# Patient Record
Sex: Female | Born: 1970 | Race: Black or African American | Hispanic: No | Marital: Single | Smoking: Never smoker
Health system: Southern US, Community
[De-identification: ages and names within clinical notes are randomized; demographics above are authoritative.]

## PROBLEM LIST (undated history)

## (undated) DIAGNOSIS — R200 Anesthesia of skin: Secondary | ICD-10-CM

## (undated) DIAGNOSIS — I10 Essential (primary) hypertension: Secondary | ICD-10-CM

## (undated) DIAGNOSIS — Z973 Presence of spectacles and contact lenses: Secondary | ICD-10-CM

## (undated) DIAGNOSIS — R202 Paresthesia of skin: Secondary | ICD-10-CM

## (undated) HISTORY — PX: SHOULDER SURGERY: SHX246

---

## 2007-05-18 ENCOUNTER — Ambulatory Visit (HOSPITAL_COMMUNITY): Admission: RE | Admit: 2007-05-18 | Discharge: 2007-05-18 | Payer: Self-pay | Admitting: *Deleted

## 2013-01-14 ENCOUNTER — Inpatient Hospital Stay (HOSPITAL_COMMUNITY): Payer: Self-pay

## 2013-01-14 ENCOUNTER — Encounter (HOSPITAL_COMMUNITY): Payer: Self-pay | Admitting: Medical

## 2013-01-14 ENCOUNTER — Inpatient Hospital Stay (HOSPITAL_COMMUNITY)
Admission: AD | Admit: 2013-01-14 | Discharge: 2013-01-14 | Disposition: A | Discharge status: Home / Self Care | Payer: Self-pay | Source: Ambulatory Visit | Attending: Obstetrics and Gynecology | Admitting: Obstetrics and Gynecology

## 2013-01-14 DIAGNOSIS — O209 Hemorrhage in early pregnancy, unspecified: Secondary | ICD-10-CM | POA: Insufficient documentation

## 2013-01-14 DIAGNOSIS — O4690 Antepartum hemorrhage, unspecified, unspecified trimester: Secondary | ICD-10-CM

## 2013-01-14 DIAGNOSIS — R109 Unspecified abdominal pain: Secondary | ICD-10-CM | POA: Insufficient documentation

## 2013-01-14 DIAGNOSIS — O10019 Pre-existing essential hypertension complicating pregnancy, unspecified trimester: Secondary | ICD-10-CM | POA: Insufficient documentation

## 2013-01-14 HISTORY — DX: Essential (primary) hypertension: I10

## 2013-01-14 LAB — URINALYSIS, ROUTINE W REFLEX MICROSCOPIC
Bilirubin Urine: NEGATIVE
Glucose, UA: NEGATIVE mg/dL
Leukocytes, UA: NEGATIVE
pH: 5.5 (ref 5.0–8.0)

## 2013-01-14 LAB — ABO/RH: ABO/RH(D): A POS

## 2013-01-14 LAB — CBC
HCT: 36.3 % (ref 36.0–46.0)
Hemoglobin: 12.3 g/dL (ref 12.0–15.0)
MCH: 27.2 pg (ref 26.0–34.0)
RDW: 13.8 % (ref 11.5–15.5)
WBC: 6.2 10*3/uL (ref 4.0–10.5)

## 2013-01-14 LAB — WET PREP, GENITAL: Yeast Wet Prep HPF POC: NONE SEEN

## 2013-01-14 LAB — URINE MICROSCOPIC-ADD ON

## 2013-01-14 LAB — POCT PREGNANCY, URINE: Preg Test, Ur: POSITIVE — AB

## 2013-01-14 NOTE — MAU Note (Signed)
Patient states she had a positive pregnancy test at the [facility] today. States she started her last period on 6-25 and has had light bleeding every day since that day until yesterday when it became heavy. States no pain today but did have pain with the heavy bleeding yesterday. Today having a small amount of brown bleeding.

## 2013-01-14 NOTE — MAU Provider Note (Signed)
History     CSN: [id number]  Arrival date and time: 01/14/13 1206   First Provider Initiated Contact with Patient 01/14/13 1350      Chief Complaint  Patient presents with  . Possible Pregnancy  . Vaginal Bleeding   HPI Ms. Jasmin Santana is a 42 y.o. G3P2002 at [redacted]w[redacted]d who presents to MAU today with vaginal bleeding. The patient was seen at [facility] this morning for vaginal bleeding x 1 month and had +UPT. Patient states that bleeding was heavier last night than today. She had some lower abdominal pain that was worse with the bleeding yesterday. She has mild pain now. She denies other vaginal discharge, UTI symptoms, N/V/D or constipation or fever. She has chronic HTN and was started on Labetalol this morning at [facility].   OB History   Grav Para Term Preterm Abortions TAB SAB Ect Mult Living   3 2 2  0 0 0 0 0 0 2      Past Medical History  Diagnosis Date  . Hypertension     Past Surgical History  Procedure Laterality Date  . Cesarean section      History reviewed. No pertinent family history.  History  Substance Use Topics  . Smoking status: Never Smoker   . Smokeless tobacco: Not on file  . Alcohol Use: No    Allergies: No Known Allergies  Prescriptions prior to admission  Medication Sig Dispense Refill  . Acetaminophen (ACETAMIN PO) Take 2 tablets by mouth daily as needed (for pain).      Marland Kitchen labetalol (NORMODYNE) 100 MG tablet Take 100 mg by mouth 2 (two) times daily.        Review of Systems  Constitutional: Negative for fever and malaise/fatigue.  Gastrointestinal: Positive for abdominal pain. Negative for nausea, vomiting, diarrhea and constipation.  Genitourinary: Negative for dysuria, urgency and frequency.       + vaginal bleeding Neg - vaginal discharge   Physical Exam   Blood pressure 156/86, pulse 68, temperature 99.5 F (37.5 C), temperature source Oral, resp. rate 16, height 5\' 5"  (1.651 m), weight 191 lb (86.637 kg), last menstrual  period 12/11/2012, SpO2 100.00%.  Physical Exam  Constitutional: She is oriented to person, place, and time. She appears well-developed and well-nourished. No distress.  HENT:  Head: Normocephalic and atraumatic.  Cardiovascular: Normal rate.   Respiratory: Effort normal.  Genitourinary: Uterus is enlarged (exam limited by maternal body habitus). Uterus is not tender. Cervix exhibits no motion tenderness, no discharge and no friability. Right adnexum displays no mass and no tenderness. Left adnexum displays no mass and no tenderness. There is bleeding (small amount of active bleeding noted in the vaginal vault) around the vagina. No vaginal discharge found.  Neurological: She is alert and oriented to person, place, and time.  Skin: Skin is warm and dry. No erythema.  Psychiatric: She has a normal mood and affect.   Results for orders placed during the hospital encounter of 01/14/13 (from the past 24 hour(s))  URINALYSIS, ROUTINE W REFLEX MICROSCOPIC     Status: Abnormal   Collection Time    01/14/13  1:15 PM      Result Value Range   Color, Urine YELLOW  YELLOW   APPearance CLEAR  CLEAR   Specific Gravity, Urine 1.020  1.005 - 1.030   pH 5.5  5.0 - 8.0   Glucose, UA NEGATIVE  NEGATIVE mg/dL   Hgb urine dipstick MODERATE (*) NEGATIVE   Bilirubin Urine NEGATIVE  NEGATIVE  Ketones, ur NEGATIVE  NEGATIVE mg/dL   Protein, ur NEGATIVE  NEGATIVE mg/dL   Urobilinogen, UA 0.2  0.0 - 1.0 mg/dL   Nitrite NEGATIVE  NEGATIVE   Leukocytes, UA NEGATIVE  NEGATIVE  URINE MICROSCOPIC-ADD ON     Status: Abnormal   Collection Time    01/14/13  1:15 PM      Result Value Range   Squamous Epithelial / LPF FEW (*) RARE   RBC / HPF 0-2  <3 RBC/hpf   Bacteria, UA RARE  RARE  POCT PREGNANCY, URINE     Status: Abnormal   Collection Time    01/14/13  1:53 PM      Result Value Range   Preg Test, Ur POSITIVE (*) NEGATIVE  WET PREP, GENITAL     Status: Abnormal   Collection Time    01/14/13  2:00 PM       Result Value Range   Yeast Wet Prep HPF POC NONE SEEN  NONE SEEN   Trich, Wet Prep NONE SEEN  NONE SEEN   Clue Cells Wet Prep HPF POC FEW (*) NONE SEEN   WBC, Wet Prep HPF POC MODERATE (*) NONE SEEN  CBC     Status: None   Collection Time    01/14/13  2:21 PM      Result Value Range   WBC 6.2  4.0 - 10.5 K/uL   RBC 4.53  3.87 - 5.11 MIL/uL   Hemoglobin 12.3  12.0 - 15.0 g/dL   HCT 40.9  81.1 - 91.4 %   MCV 80.1  78.0 - 100.0 fL   MCH 27.2  26.0 - 34.0 pg   MCHC 33.9  30.0 - 36.0 g/dL   RDW 78.2  95.6 - 21.3 %   Platelets 198  150 - 400 K/uL  ABO/RH     Status: None   Collection Time    01/14/13  2:21 PM      Result Value Range   ABO/RH(D) A POS    HCG, QUANTITATIVE, PREGNANCY     Status: Abnormal   Collection Time    01/14/13  2:21 PM      Result Value Range   hCG, Beta Chain, Quant, S 4702 (*) <5 mIU/mL    MAU Course  Procedures None  MDM +UPT Wet prep, GC/Chlamydia, CBC, ABO/Rh, quant hCG and [place] today  Assessment and Plan  A: Vaginal bleeding in pregnancy Abdominal pain in pregnancy Chronic hypertension  P: Discharge home Bleeding precautions discussed Patient will return to MAU in 48 hours for follow-up quant hCG to confirm IUP vs SAB vs ectopic Patient may return to MAU sooner as needed or if her condition were to change or worsen  Freddi Starr, PA-C  01/14/2013, 4:06 PM

## 2013-01-16 ENCOUNTER — Inpatient Hospital Stay (HOSPITAL_COMMUNITY)
Admission: AD | Admit: 2013-01-16 | Discharge: 2013-01-16 | Disposition: A | Discharge status: Home / Self Care | Payer: Self-pay | Source: Ambulatory Visit | Attending: Obstetrics & Gynecology | Admitting: Obstetrics & Gynecology

## 2013-01-16 DIAGNOSIS — O034 Incomplete spontaneous abortion without complication: Secondary | ICD-10-CM

## 2013-01-16 LAB — HCG, QUANTITATIVE, PREGNANCY: hCG, Beta Chain, Quant, S: 1099 m[IU]/mL — ABNORMAL HIGH (ref <5)

## 2013-01-16 NOTE — MAU Note (Signed)
Pt here for BHCG level. Reports still having some vaginal bleeding but less than the other day. No pain.

## 2013-01-16 NOTE — MAU Provider Note (Signed)
S: 42 y.o. [id number] @[redacted]w[redacted]d  by LMP presents to MAU for f/u quant hcg.  She reports vaginal bleeding has decreased since her MAU visit 2 days ago and denies h/a, dizziness, n/v, or fever/chills.    O: BP 147/93  Pulse 74  Resp 18  LMP 12/11/2012   HCG, QUANTITATIVE, PREGNANCY     Status: Abnormal   Collection Time    01/14/13  2:21 PM      Result Value Range   hCG, Beta Chain, Quant, S 4702 (*) <5 mIU/mL   Comment:         HCG, QUANTITATIVE, PREGNANCY     Status: Abnormal   Collection Time    01/16/13  1:59 PM      Result Value Range   hCG, Beta Chain, Quant, S 1099 (*) <5 mIU/mL   Comment:          A: 1. Incomplete miscarriage    P: F/U in clinic in 1 week for quant hcg Packet of information given to pt about miscarriage/loss Return to MAU as needed  [employer] Certified Nurse-Midwife

## 2013-01-16 NOTE — MAU Provider Note (Signed)
Attestation of Attending Supervision of Advanced Practitioner (PA/CNM/NP): Evaluation and management procedures were performed by the Advanced Practitioner under my supervision and collaboration.  I have reviewed the Advanced Practitioner's note and chart, and I agree with the management and plan.  Sofiya Ezelle, MD, FACOG Attending Obstetrician & Gynecologist Faculty Practice, [facility]   

## 2013-01-20 NOTE — MAU Provider Note (Signed)
Attestation of Attending Supervision of Advanced Practitioner (CNM/NP): Evaluation and management procedures were performed by the Advanced Practitioner under my supervision and collaboration.  I have reviewed the Advanced Practitioner's note and chart, and I agree with the management and plan.  Jasmin Santana 01/20/2013 1:19 PM

## 2013-01-23 ENCOUNTER — Inpatient Hospital Stay (HOSPITAL_COMMUNITY)
Admission: AD | Admit: 2013-01-23 | Discharge: 2013-01-23 | Disposition: A | Discharge status: Home / Self Care | Payer: Self-pay | Source: Ambulatory Visit | Attending: Obstetrics & Gynecology | Admitting: Obstetrics & Gynecology

## 2013-01-23 ENCOUNTER — Other Ambulatory Visit: Payer: Self-pay

## 2013-01-23 DIAGNOSIS — O039 Complete or unspecified spontaneous abortion without complication: Secondary | ICD-10-CM

## 2013-01-24 ENCOUNTER — Telehealth: Payer: Self-pay | Admitting: General Practice

## 2013-01-24 LAB — HCG, QUANTITATIVE, PREGNANCY: hCG, Beta Chain, Quant, S: 46.8 m[IU]/mL

## 2013-01-24 NOTE — Telephone Encounter (Signed)
Called patient and informed her of results and need for another lab draw next Thursday the 14th and asked when she could come in. Patient stated she would be here at 1:30 that day. Patient had no further questions

## 2013-01-24 NOTE — Telephone Encounter (Signed)
Message copied by Kathee Delton on Fri Jan 24, 2013 11:20 AM ------      Message from: Catalina Antigua      Created: Fri Jan 24, 2013  8:06 AM       Please inform patient that she needs to return next week for repeat quant HCG. The hormone levels are decreasing but they need to be less than 5.            Thanks            Clinical cytogeneticist ------

## 2013-01-30 ENCOUNTER — Other Ambulatory Visit: Payer: Self-pay

## 2013-01-30 DIAGNOSIS — O039 Complete or unspecified spontaneous abortion without complication: Secondary | ICD-10-CM

## 2013-01-31 LAB — HCG, QUANTITATIVE, PREGNANCY: hCG, Beta Chain, Quant, S: 8.1 m[IU]/mL

## 2013-11-19 ENCOUNTER — Encounter (HOSPITAL_COMMUNITY): Payer: Self-pay | Admitting: *Deleted

## 2014-04-20 ENCOUNTER — Encounter (HOSPITAL_COMMUNITY): Payer: Self-pay | Admitting: *Deleted

## 2014-11-27 ENCOUNTER — Encounter (HOSPITAL_COMMUNITY): Payer: Self-pay | Admitting: Nurse Practitioner

## 2014-11-27 ENCOUNTER — Emergency Department (HOSPITAL_COMMUNITY)
Admission: EM | Admit: 2014-11-27 | Discharge: 2014-11-27 | Disposition: A | Discharge status: Home / Self Care | Payer: BLUE CROSS/BLUE SHIELD | Attending: Emergency Medicine | Admitting: Emergency Medicine

## 2014-11-27 DIAGNOSIS — Z79899 Other long term (current) drug therapy: Secondary | ICD-10-CM | POA: Insufficient documentation

## 2014-11-27 DIAGNOSIS — X58XXXA Exposure to other specified factors, initial encounter: Secondary | ICD-10-CM | POA: Diagnosis not present

## 2014-11-27 DIAGNOSIS — Y9289 Other specified places as the place of occurrence of the external cause: Secondary | ICD-10-CM | POA: Diagnosis not present

## 2014-11-27 DIAGNOSIS — Y9389 Activity, other specified: Secondary | ICD-10-CM | POA: Diagnosis not present

## 2014-11-27 DIAGNOSIS — T161XXA Foreign body in right ear, initial encounter: Secondary | ICD-10-CM | POA: Diagnosis not present

## 2014-11-27 DIAGNOSIS — Y998 Other external cause status: Secondary | ICD-10-CM | POA: Insufficient documentation

## 2014-11-27 DIAGNOSIS — I10 Essential (primary) hypertension: Secondary | ICD-10-CM | POA: Diagnosis not present

## 2014-11-27 NOTE — Discharge Instructions (Signed)
Ear Foreign Body An ear foreign body is an object that is stuck in the ear. It is common for young children to put objects into the ear canal. These may include pebbles, beads, beans, and any other small objects which will fit. In adults, objects such as cotton swabs may become lodged in the ear canal. In all ages, the most common foreign bodies are insects that enter the ear canal.  SYMPTOMS  Foreign bodies may cause pain, buzzing or roaring sounds, hearing loss, and ear drainage.  HOME CARE INSTRUCTIONS   Keep all follow-up appointments with your caregiver as told.  Keep small objects out of reach of young children. Tell them not to put anything in their ears. SEEK IMMEDIATE MEDICAL CARE IF:   You have bleeding from the ear.  You have increased pain or swelling of the ear.  You have reduced hearing.  You have discharge coming from the ear.  You have a fever.  You have a headache. MAKE SURE YOU:   Understand these instructions.  Will watch your condition.  Will get help right away if you are not doing well or get worse. Document Released: 06/02/2000 Document Revised: 08/28/2011 Document Reviewed: 01/22/2008 [facility] Patient Information 2015 [place], [place]. This information is not intended to replace advice given to you by your health care provider. Make sure you discuss any questions you have with your health care provider.  Emergency Department Resource Guide 1) Find a Doctor and Pay Out of Pocket Although you won't have to find out who is covered by your insurance plan, it is a good idea to ask around and get recommendations. You will then need to call the office and see if the doctor you have chosen will accept you as a new patient and what types of options they offer for patients who are self-pay. Some doctors offer discounts or will set up payment plans for their patients who do not have insurance, but you will need to ask so you aren't surprised when you get to your  appointment.  2) Contact Your Local Health Department Not all health departments have doctors that can see patients for sick visits, but many do, so it is worth a call to see if yours does. If you don't know where your local health department is, you can check in your phone book. The CDC also has a tool to help you locate your state's health department, and many state websites also have listings of all of their local health departments.  3) Find a Walk-in Clinic If your illness is not likely to be very severe or complicated, you may want to try a walk in clinic. These are popping up all over the country in pharmacies, drugstores, and shopping centers. They're usually staffed by nurse practitioners or physician assistants that have been trained to treat common illnesses and complaints. They're usually fairly quick and inexpensive. However, if you have serious medical issues or chronic medical problems, these are probably not your best option.  No Primary Care Doctor: - Call Health Connect at  [phone] - they can help you locate a primary care doctor that  accepts your insurance, provides certain services, etc. - Physician Referral Service- [phone]  Chronic Pain Problems: Organization         Address  Phone   Notes  [facility]  [phone] Patients need to be referred by their primary care doctor.   Medication Assistance: Organization  Address  Phone   Notes  [facility] Medication [facility] [address] [place], [place] [address] [phone] --Must be a resident of [facility] -- Must have NO insurance coverage whatsoever (no Medicaid/ Medicare, etc.) -- The pt. MUST have a primary care doctor that directs their care regularly and follows them in the community   MedAssist  [phone]   [employer]  [phone]    Agencies that provide inexpensive medical care: Organization         Address  Phone   Notes  [place]  [phone]   [facility]    [phone]   [facility] [place], [place] [address] [phone]   [place] [address], [place] [phone]   Planned Parenthood    [phone]   [facility]    [phone]   [place] and [place] [address], [place] Phone:  [phone], Fax:  [phone] Hours of Operation:  9 am - 6 pm, M-F.  Also accepts Medicaid/Medicare and self-pay.  [facility] for [place], [address], [place] Phone: [phone], Fax: [phone]. Hours of Operation:  8:30 am - 5:30 pm, M-F.  Also accepts Medicaid and self-pay.  [facility] [place] [address], [place] Phone: [phone]   [place], [place], [place] [phone], Ext. [phone] Mondays & Thursdays: 7-9 AM.  First 15 patients are seen on a first come, first serve basis.    [place] Providers:  Organization         Address  Phone   Notes  [facility] [address], [place] [phone] Also accepts self-pay patients.  [facility] [address], [place]  [phone]   [facility], [address], [place] [phone]   [facility] [address], [place] [phone]   Lucianne Lei [address], [place]   [phone] Only accepts [place] Access [place] patients after they have their name applied to their card.   Self-Pay (no insurance) in [facility]:  Organization         Address  Phone   Notes  Sickle Cell Patients, [facility] [place] [phone]   [facility] [place] [phone]   [facility]  [place]  [place], [place], [place] [phone]   [facility]/Dr. Osei-Bonsu  [address], [place] or [address], [place] [phone] Phone number for both [place] and [place] locations is the same.  Urgent Medical and [facility] [address] [place] [phone]   [facility] [address] [place] or [address] [phone]   [facility] [address], [place] [phone], phone; [phone], fax Sees patients 1st and 3rd Saturday of every month.  Must not qualify for public or private insurance (i.e. Medicaid, Medicare, [place] Health Choice, Veterans' Benefits)  Household income should be no more than 200% of the poverty level The clinic cannot treat you if you are pregnant or think you  are pregnant  Sexually transmitted diseases are not treated at the clinic.    Dental Care: Organization         Address  Phone  Notes  [facility] Department of [facility] [place] [phone] Accepts children up to age 77 who are enrolled in [place] or [place]; pregnant women with a Medicaid card; and children who have applied for Medicaid or [place] Health Choice, but were declined, whose parents can pay a reduced fee at time of service.  [facility] Department of [facility]  [address], [place] [phone] Accepts children up to age 65 who are enrolled in [place] or [place]; pregnant women with a Medicaid card; and children who have applied for Medicaid or [place] Health Choice, but were declined, whose parents can pay a reduced fee at time of service.  [place] Adult Dental Access PROGRAM  [place] [phone] Patients are seen by appointment only. Walk-ins are not accepted. [place] will see patients 53 years of age and  older. Monday - Tuesday (8am-5pm) Most Wednesdays (8:30-5pm) $30 per visit, cash only  [facility] Adult Dental Access PROGRAM  [address], [facility] [phone] Patients are seen by appointment only. Walk-ins are not accepted. [place] will see patients 33 years of age and older. One Wednesday Evening (Monthly: Volunteer Based).  $30 per visit, cash only  [place]  [phone] for adults; Children under age 19, call Graduate Pediatric Dentistry at [phone]. Children aged 18-14, please call [phone] to request a pediatric application.  Dental services are provided in all areas of dental care including fillings, crowns and bridges, complete and partial dentures, implants, gum treatment, root canals, and extractions. Preventive care is also provided. Treatment is provided to both adults and children. Patients are selected via a lottery and there is often a waiting list.   [facility] [address], [place]  [phone] [email or web address]   [facility] [address] [place], [place] [phone], Ext. [phone] Second and Fourth Thursday of each month, opens at 6:30 AM; Clinic ends at 9 AM.  Patients are seen on a first-come first-served basis, and a limited number are seen during each clinic.   [facility]  [address] Hillard Danker [place], [place] [phone]   Eligibility Requirements You must have lived in [place], [place], or [place] counties for at least the last three months.   You cannot be eligible for state or federal sponsored [employer], including [employer], [place], or [employer].   You generally cannot be eligible for healthcare insurance through your employer.    How to apply: Eligibility screenings are held every Tuesday and Wednesday afternoon from 1:00 pm until 4:00 pm. You do not need an appointment for the interview!  [facility] [address]  [place], [place]   [place]  [place] Department  [place]  [phone]    Behavioral Health Resources in the Community: Intensive Outpatient Programs Organization         Address  Phone  Notes  [place]. [address] [place], [place] [phone]   [facility] Outpatient [address] [place], [place]   ADS: Alcohol & Drug Svcs [address]  Dr, [place], [place]   [place] [address]  [place], [place] or [phone]   Substance Abuse Resources Organization         Address  Phone  Notes  Alcohol and Drug Services  [phone]   [place]  [phone]   [facility] [place]   Chinita Pester  ([phone])[phone]   Residential & Outpatient Substance Abuse Program  [phone]   Psychological Services Organization         Address  Phone  Notes  [facility] [place]  [place]  [phone]   [place] [address], [place] or [phone]    Mobile Crisis Teams Organization         Address  Phone  Notes  [facility], Mobile Crisis Care Unit  [phone]   [facility] Psychotherapeutic Services  [address] [place], [place]   [place] [address], [place] [phone]    Self-Help/Support Groups Organization         Address  Phone             Notes  [place]. of [place] - variety of support groups  [place] Call for more information  Narcotics Anonymous (NA), [facility] [address], [employer] [place]  2 meetings at this location   Special educational needs teacher         Address  Phone  Notes  [facility] Residential Treatment [place],    [place]  [phone]   [facility]  [address], [place] [address], [place], [place]   [place], [place] [phone] Admissions: 8am-3pm M-F  Incentives Substance [place] [address],    [place], [place] [phone]   [facility] [address] [place], [place], [place]   [facility] [address],  [place], [place]   Insight Programs - Intensive Outpatient [address] Krist[address], [place], [place]   [facility] ([place].,  [place], [place] [phone] or [phone]   [facility]idential Treatment Services (RTS) [address] [place], [place] Accepts Medicaid  [facility] [place] [address]  [place] [phone] Substance Abuse/Addiction Treatment   [facility] Organization         Address  Phone  Notes  [facility]  [phone]   Domenic Schwab, PhD [address] Arlis Porta [place], [place]   [phone] or [phone]   [facility], [place] [phone]   [facility] [address] [place], [place] [phone] Insurance/Medicaid/sponsorship through [facility] and Families [address] [place]                                    [place], [place] [phone] [place] [address], [place] [phone]    Dr. Adele Schilder  [phone]   [facility] [place] Dept. 1) [address] [place] 2) [place] 3)  [address], [place] [phone]  [phone]   [place] [phone])  [phone] or [phone] (After Hours)    ° ° ° °

## 2014-11-27 NOTE — ED Notes (Signed)
Pt put q-tip in right ear to clean it out and sts took it out and there was nothing on the end of it. Pt denies pain or hearing loss.

## 2014-11-27 NOTE — ED Provider Notes (Signed)
CSN: [id number]     Arrival date & time 11/27/14  1811 History  This chart was scribed for Catha Gosselin, PA-C working with No att. providers found by Elveria Rising, ED Scribe. This patient was seen in room TR06C/TR06C and the patient's care was started at 6:26 PM.   Chief Complaint  Patient presents with  . Foreign Body in Ear   The history is provided by the patient. No language interpreter was used.   HPI Comments: Jasmin Santana is a 44 y.o. female who presents to the Emergency Department complaining of foreign body in right ear. Patient states that she was cleaning her ear with a q-tip today and the cotton swab lodged in her ear at 11am today. Patient states that she attempted to remove the cotton by pouring olive oil in the ear; she was unsuccessful. Patient denies reduced hearing, but reports mild pain.  Patient denies history of ear surgeries or complications.   Past Medical History  Diagnosis Date  . Hypertension    Past Surgical History  Procedure Laterality Date  . Cesarean section     No family history on file. History  Substance Use Topics  . Smoking status: Never Smoker   . Smokeless tobacco: Not on file  . Alcohol Use: No   OB History    Gravida Para Term Preterm AB TAB SAB Ectopic Multiple Living   0 0 0 0 0 0 2     Review of Systems  Constitutional: Negative for fever and chills.  HENT: Positive for ear pain. Negative for ear discharge and hearing loss.       Allergies  Review of patient's allergies indicates no known allergies.  Home Medications   Prior to Admission medications   Medication Sig Start Date End Date Taking? Authorizing Provider  Acetaminophen (ACETAMIN PO) Take 2 tablets by mouth daily as needed (for pain).    Historical Provider, MD  labetalol (NORMODYNE) 100 MG tablet Take 100 mg by mouth 2 (two) times daily.    Historical Provider, MD   BP 126/91 mmHg  Pulse 81  Temp(Src) 98.4 F (36.9 C) (Oral)  Resp 16  SpO2  100% Physical Exam  Constitutional: She is oriented to person, place, and time. She appears well-developed and well-nourished. No distress.  HENT:  Head: Normocephalic and atraumatic.  Right Ear: Hearing and external ear normal. A foreign body is present.  Left Ear: Hearing, tympanic membrane, external ear and ear canal normal.  Green colored cotton swab sitting against TM.  Eyes: EOM are normal.  Neck: Neck supple. No tracheal deviation present.  Cardiovascular: Normal rate.   Pulmonary/Chest: Effort normal. No respiratory distress.  Musculoskeletal: Normal range of motion.  Neurological: She is alert and oriented to person, place, and time.  Skin: Skin is warm and dry.  Psychiatric: She has a normal mood and affect. Her behavior is normal.  Nursing note and vitals reviewed.   ED Course  FOREIGN BODY REMOVAL Date/Time: 11/27/2014 8:10 PM Performed by: Catha Gosselin Authorized by: Catha Gosselin Consent: Verbal consent obtained. Written consent obtained. Risks and benefits: risks, benefits and alternatives were discussed Consent given by: patient Patient understanding: patient states understanding of the procedure being performed Patient consent: the patient's understanding of the procedure matches consent given Patient identity confirmed: verbally with patient Body area: ear Location details: right ear Anesthesia method: No anesthesia was used. Patient sedated: no Patient restrained: no Patient cooperative: yes Localization method: ENT speculum (Otoscope was used) Removal mechanism:  alligator forceps, irrigation and wire loop Complexity: simple 1 objects recovered. Post-procedure assessment: foreign body removed Patient tolerance: Patient tolerated the procedure well with no immediate complications Comments: The cotton-wool from the end of a Q-tip was removed from the right ear. The plastic loop was used to remove the cotton-wool from the TM, into the ear canal. The  canal was then irrigated using peroxide and saline. The cotton wool was easily removed thereafter with alligator forceps.   (including critical care time)  COORDINATION OF CARE: 6:33 PM- Discussed treatment plan with patient at bedside and patient agreed to plan.   Labs Review Labs Reviewed - No data to display  Imaging Review No results found.   EKG Interpretation None      MDM   Final diagnoses:  Foreign body of ear, right, initial encounter  Patient presents for right ear foreign body. I was able to visualize the right ear canal and TM post cotton-wool removal. The TM was intact without perforation. The ear canal was without laceration or erythema. She can take ibuprofen for pain. She can follow up with a primary care provider using the resource guide and verbally agrees with the plan. I personally performed the services described in this documentation, which was scribed in my presence. The recorded information has been reviewed and is accurate.    Catha Gosselin, PA-C 11/27/14 2019  Tilden Fossa, MD 11/28/14 Rich Fuchs

## 2014-11-27 NOTE — ED Notes (Signed)
Pt. Was cleaning her right ear with a q-tip around 11am this morning, she pulled the q-tip out and the cotton part from the q-tip was missing. Complaint of "every now and then" difficulty hearing out of right ear. No dizziness/pain.

## 2015-01-08 ENCOUNTER — Other Ambulatory Visit: Payer: Self-pay | Admitting: Specialist

## 2015-01-08 DIAGNOSIS — M545 Low back pain: Secondary | ICD-10-CM

## 2015-01-12 ENCOUNTER — Ambulatory Visit
Admission: RE | Admit: 2015-01-12 | Discharge: 2015-01-12 | Disposition: A | Discharge status: Home / Self Care | Payer: Worker's Compensation | Source: Ambulatory Visit | Attending: Specialist | Admitting: Specialist

## 2015-01-12 DIAGNOSIS — M545 Low back pain: Secondary | ICD-10-CM

## 2015-03-02 ENCOUNTER — Ambulatory Visit: Payer: Self-pay | Admitting: Orthopedic Surgery

## 2015-03-05 ENCOUNTER — Encounter (INDEPENDENT_AMBULATORY_CARE_PROVIDER_SITE_OTHER): Payer: Self-pay

## 2015-03-05 ENCOUNTER — Ambulatory Visit (HOSPITAL_COMMUNITY)
Admission: RE | Admit: 2015-03-05 | Discharge: 2015-03-05 | Disposition: A | Discharge status: Home / Self Care | Payer: Worker's Compensation | Source: Ambulatory Visit | Attending: Orthopedic Surgery | Admitting: Orthopedic Surgery

## 2015-03-05 ENCOUNTER — Encounter (HOSPITAL_COMMUNITY): Payer: Self-pay

## 2015-03-05 ENCOUNTER — Ambulatory Visit: Payer: Self-pay | Admitting: Orthopedic Surgery

## 2015-03-05 ENCOUNTER — Encounter (HOSPITAL_COMMUNITY)
Admission: RE | Admit: 2015-03-05 | Discharge: 2015-03-05 | Disposition: A | Discharge status: Home / Self Care | Payer: Worker's Compensation | Source: Ambulatory Visit | Attending: Specialist | Admitting: Specialist

## 2015-03-05 DIAGNOSIS — M5126 Other intervertebral disc displacement, lumbar region: Secondary | ICD-10-CM | POA: Diagnosis not present

## 2015-03-05 DIAGNOSIS — Z01812 Encounter for preprocedural laboratory examination: Secondary | ICD-10-CM | POA: Insufficient documentation

## 2015-03-05 DIAGNOSIS — Z01818 Encounter for other preprocedural examination: Secondary | ICD-10-CM | POA: Diagnosis present

## 2015-03-05 DIAGNOSIS — R001 Bradycardia, unspecified: Secondary | ICD-10-CM | POA: Insufficient documentation

## 2015-03-05 HISTORY — DX: Presence of spectacles and contact lenses: Z97.3

## 2015-03-05 HISTORY — DX: Paresthesia of skin: R20.2

## 2015-03-05 HISTORY — DX: Anesthesia of skin: R20.0

## 2015-03-05 LAB — CBC
HEMATOCRIT: 34.2 % — AB (ref 36.0–46.0)
HEMOGLOBIN: 10.5 g/dL — AB (ref 12.0–15.0)
MCH: 21.3 pg — ABNORMAL LOW (ref 26.0–34.0)
MCHC: 30.7 g/dL (ref 30.0–36.0)
MCV: 69.4 fL — ABNORMAL LOW (ref 78.0–100.0)
Platelets: 270 10*3/uL (ref 150–400)
RBC: 4.93 MIL/uL (ref 3.87–5.11)
RDW: 16.8 % — ABNORMAL HIGH (ref 11.5–15.5)
WBC: 5.5 10*3/uL (ref 4.0–10.5)

## 2015-03-05 LAB — SURGICAL PCR SCREEN
MRSA, PCR: NEGATIVE
STAPHYLOCOCCUS AUREUS: POSITIVE — AB

## 2015-03-05 LAB — BASIC METABOLIC PANEL
ANION GAP: 9 (ref 5–15)
BUN: 11 mg/dL (ref 6–20)
CALCIUM: 9.6 mg/dL (ref 8.9–10.3)
CO2: 25 mmol/L (ref 22–32)
Chloride: 102 mmol/L (ref 101–111)
Creatinine, Ser: 0.78 mg/dL (ref 0.44–1.00)
GLUCOSE: 81 mg/dL (ref 65–99)
POTASSIUM: 3.5 mmol/L (ref 3.5–5.1)
Sodium: 136 mmol/L (ref 135–145)

## 2015-03-05 LAB — HCG, SERUM, QUALITATIVE: PREG SERUM: NEGATIVE

## 2015-03-05 NOTE — Patient Instructions (Signed)
Jasmin Santana  03/05/2015   Your procedure is scheduled on: Wednesday March 10, 2015   Report to [facility]  Entrance take [place]  elevators to 3rd floor to  Short Stay Center at 6:30 AM.  Call this number if you have problems the morning of surgery [phone]   Remember: ONLY 1 PERSON MAY GO WITH YOU TO SHORT STAY TO GET  READY MORNING OF YOUR SURGERY.  Do not eat food or drink liquids :After Midnight.     Take these medicines the morning of surgery with A SIP OF WATER: Gabapentin (Neurontin)                               You may not have any metal on your body including hair pins and              piercings  Do not wear jewelry, make-up, lotions, powders or perfumes, deodorant             Do not wear nail polish.  Do not shave  48 hours prior to surgery.                 Do not bring valuables to the hospital. [facility] IS NOT             RESPONSIBLE   FOR VALUABLES.  Contacts, dentures or bridgework may not be worn into surgery.  Leave suitcase in the car. After surgery it may be brought to your room.              Please read over the following fact sheets you were given:MRSA INFORMATION SHEET; INCENTIVE SPIROMETER _____________________________________________________________________             [facility] - Preparing for Surgery Before surgery, you can play an important role.  Because skin is not sterile, your skin needs to be as free of germs as possible.  You can reduce the number of germs on your skin by washing with CHG (chlorahexidine gluconate) soap before surgery.  CHG is an antiseptic cleaner which kills germs and bonds with the skin to continue killing germs even after washing. Please DO NOT use if you have an allergy to CHG or antibacterial soaps.  If your skin becomes reddened/irritated stop using the CHG and inform your nurse when you arrive at Short Stay. Do not shave (including legs and underarms) for at least 48 hours prior to the  first CHG shower.  You may shave your face/neck. Please follow these instructions carefully:  1.  Shower with CHG Soap the night before surgery and the  morning of Surgery.  2.  If you choose to wash your hair, wash your hair first as usual with your  normal  shampoo.  3.  After you shampoo, rinse your hair and body thoroughly to remove the  shampoo.                           4.  Use CHG as you would any other liquid soap.  You can apply chg directly  to the skin and wash                       Gently with a scrungie or clean washcloth.  5.  Apply the CHG Soap to your body  ONLY FROM THE NECK DOWN.   Do not use on face/ open                           Wound or open sores. Avoid contact with eyes, ears mouth and genitals (private parts).                       Wash face,  Genitals (private parts) with your normal soap.             6.  Wash thoroughly, paying special attention to the area where your surgery  will be performed.  7.  Thoroughly rinse your body with warm water from the neck down.  8.  DO NOT shower/wash with your normal soap after using and rinsing off  the CHG Soap.                9.  Pat yourself dry with a clean towel.            10.  Wear clean pajamas.            11.  Place clean sheets on your bed the night of your first shower and do not  sleep with pets. Day of Surgery : Do not apply any lotions/deodorants the morning of surgery.  Please wear clean clothes to the hospital/surgery center.  FAILURE TO FOLLOW THESE INSTRUCTIONS MAY RESULT IN THE CANCELLATION OF YOUR SURGERY PATIENT SIGNATURE_________________________________  NURSE SIGNATURE__________________________________  ________________________________________________________________________   [name]  An incentive spirometer is a tool that can help keep your lungs clear and active. This tool measures how well you are filling your lungs with each breath. Taking long deep breaths may help reverse or decrease  the chance of developing breathing (pulmonary) problems (especially infection) following:  A long period of time when you are unable to move or be active. BEFORE THE PROCEDURE   If the spirometer includes an indicator to show your best effort, your nurse or respiratory therapist will set it to a desired goal.  If possible, sit up straight or lean slightly forward. Try not to slouch.  Hold the incentive spirometer in an upright position. INSTRUCTIONS FOR USE  1. Sit on the edge of your bed if possible, or sit up as far as you can in bed or on a chair. 2. Hold the incentive spirometer in an upright position. 3. Breathe out normally. 4. Place the mouthpiece in your mouth and seal your lips tightly around it. 5. Breathe in slowly and as deeply as possible, raising the piston or the ball toward the top of the column. 6. Hold your breath for 3-5 seconds or for as long as possible. Allow the piston or ball to fall to the bottom of the column. 7. Remove the mouthpiece from your mouth and breathe out normally. 8. Rest for a few seconds and repeat Steps 1 through 7 at least 10 times every 1-2 hours when you are awake. Take your time and take a few normal breaths between deep breaths. 9. The spirometer may include an indicator to show your best effort. Use the indicator as a goal to work toward during each repetition. 10. After each set of 10 deep breaths, practice coughing to be sure your lungs are clear. If you have an incision (the cut made at the time of surgery), support your incision when coughing by placing a pillow or rolled up towels firmly against it. Once  you are able to get out of bed, walk around indoors and cough well. You may stop using the incentive spirometer when instructed by your caregiver.  RISKS AND COMPLICATIONS  Take your time so you do not get dizzy or light-headed.  If you are in pain, you may need to take or ask for pain medication before doing incentive spirometry. It is  harder to take a deep breath if you are having pain. AFTER USE  Rest and breathe slowly and easily.  It can be helpful to keep track of a log of your progress. Your caregiver can provide you with a simple table to help with this. If you are using the spirometer at home, follow these instructions: SEEK MEDICAL CARE IF:   You are having difficultly using the spirometer.  You have trouble using the spirometer as often as instructed.  Your pain medication is not giving enough relief while using the spirometer.  You develop fever of 100.5 F (38.1 C) or higher. SEEK IMMEDIATE MEDICAL CARE IF:   You cough up bloody sputum that had not been present before.  You develop fever of 102 F (38.9 C) or greater.  You develop worsening pain at or near the incision site. MAKE SURE YOU:   Understand these instructions.  Will watch your condition.  Will get help right away if you are not doing well or get worse. Document Released: 10/16/2006 Document Revised: 08/28/2011 Document Reviewed: 12/17/2006 [facility] Patient Information 2014 [place], [place].   ________________________________________________________________________

## 2015-03-05 NOTE — H&P (Signed)
Jasmin Santana is an 44 y.o. female.   Chief Complaint: back and L leg pain HPI: The patient is a 44 year old female who presents today for follow up of their back. The patient is being followed for their central back pain. They are now 5 month(s) out from injury (10/01/2014). Symptoms reported today include: pain, pain with weightbearing and pain with standing. The patient states that they are doing poorly. Current treatment includes: NSAIDs and pain medications. The following medication has been used for pain control: Tramadol (pt. is wanting something stronger). The patient reports their current pain level to be moderate to severe and 8 / 10. The patient presents today following MRI (pt. is here today for an MRI result).  Past Medical History  Diagnosis Date  . Hypertension   . Numbness and tingling     from left hip down/both feet bilat   . Wears glasses     Past Surgical History  Procedure Laterality Date  . Cesarean section    . Shoulder surgery      left / 5 to 6 years ago     No family history on file. Social History:  reports that she has never smoked. She has never used smokeless tobacco. She reports that she does not drink alcohol or use illicit drugs.  Allergies: No Known Allergies   (Not in a hospital admission)  No results found for this or any previous visit (from the past 48 hour(s)). No results found.  Review of Systems  Constitutional: Negative.   HENT: Negative.   Eyes: Negative.   Respiratory: Negative.   Cardiovascular: Negative.   Gastrointestinal: Negative.   Genitourinary: Negative.   Musculoskeletal: Positive for back pain.  Skin: Negative.   Neurological: Positive for sensory change and focal weakness.  Psychiatric/Behavioral: Negative.     Last menstrual period 03/04/2015. Physical Exam  Constitutional: She is oriented to person, place, and time. She appears well-developed and well-nourished.  HENT:  Head: Normocephalic.  Eyes: Pupils are  equal, round, and reactive to light.  Neck: Normal range of motion.  Cardiovascular: Normal rate.   Respiratory: Effort normal.  GI: Soft.  Musculoskeletal:  On exam, in mild distress. Mood and affect is appropriate. Walks with an antalgic gait. Straight leg raise on the left produces buttock, thigh, and calf pain, exacerbated with dorsal augmentation maneuver. EHL is 4+/5. Altered sensation in the L5 dermatome. Pain with flexion and extension of the lumbar spine.  Lumbar spine exam reveals no evidence of soft tissue swelling, deformity, or skin ecchymosis. On palpation, there is no tenderness of the lumbar spine. No flank pain with percussion. The abdomen is soft and nontender. Nontender over the trochanters. No cellulitis or lymphadenopathy.  Good range of motion of the lumbar spine without associated pain. Motor is 5/5 including tibialis anterior, plantar flexion, quadriceps, and hamstrings. The patient is normoreflexic. There is no Babinski or clonus. Sensory exam is intact to light touch. The patient has good distal pulses. No DVT. No pain and normal range of motion without instability of the hips, knees, and ankles.  Neurological: She is alert and oriented to person, place, and time.  Skin: Skin is warm and dry.    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 Myotomal weakness and  dermatomal dysesthesias.  Extensive discussion with Ellee concerning the current pathology, relevant anatomy, and treatment options. Three options. One, live with her symptoms versus epidural steroid injection versus lumbar decompression. The patient feels she cannot live with her symptoms as they currently are. She returned to work, and the  symptoms were immediately exacerbated or worsened. She does not want an epidural injection. She has had injections in the past and feel that is temporary. We discussed therefore lumbar decompression, and she would like to proceed with that.  I had an extensive discussion of the risks and benefits of the lumbar decompression with the patient including bleeding, infection, damage to neurovascular structures, epidural fibrosis, CSF leak requiring repair. We also discussed increase in pain, adjacent segment disease, recurrent disc herniation, need for future surgery including repeat decompression and/or fusion. We also discussed risks of postoperative hematoma, paralysis, anesthetic complications including DVT, PE, death, cardiopulmonary dysfunction. In addition, the perioperative and postoperative courses were discussed in detail including the rehabilitative time and return to functional activity and work. I provided the patient with an illustrated handout and utilized the appropriate surgical models.  We discussed decompression at L4-L5. 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 She has asked for stronger pain medicine. I gave her Norco 5/325 mg to be taken as directed. GI side effects discussed. I provided her with an illustrated handout and discussed in detail again despite rest, activity modification, and therapy and five months out from her injury it is reasonable to proceed with lumbar decompression at this point in time. No history of MRSA exposure or allergy to penicillin noted. This is related to her initial injury.  Plan microlumbar decompression L4-5 left  BISSELL, JACLYN M. PA-C for Dr. Shelle Iron 03/05/2015, 3:33 PM

## 2015-03-08 NOTE — Progress Notes (Addendum)
Surgical screening results in epic per PAT visit 03/05/2015 positive for STAPH. Results sent to Dr Beane. Prescription for Mupriocin Ointment called to [facility] [address]; spoke with Heather / pharmacist. Pt notified and reviewed instructions (pt also has written instruction in regards to application) and instructed to bring medication with her day of surgery to continue as prescribed. Pt verbalized understanding.  

## 2015-03-10 ENCOUNTER — Ambulatory Visit (HOSPITAL_COMMUNITY): Payer: Worker's Compensation

## 2015-03-10 ENCOUNTER — Encounter (HOSPITAL_COMMUNITY): Payer: Self-pay | Admitting: Certified Registered Nurse Anesthetist

## 2015-03-10 ENCOUNTER — Encounter (HOSPITAL_COMMUNITY)
Admission: RE | Disposition: A | Discharge status: Home / Self Care | Payer: Self-pay | Source: Ambulatory Visit | Attending: Specialist

## 2015-03-10 ENCOUNTER — Ambulatory Visit (HOSPITAL_COMMUNITY): Payer: Worker's Compensation | Admitting: Certified Registered Nurse Anesthetist

## 2015-03-10 ENCOUNTER — Ambulatory Visit (HOSPITAL_COMMUNITY)
Admission: RE | Admit: 2015-03-10 | Discharge: 2015-03-12 | Disposition: A | Discharge status: Home / Self Care | Payer: Worker's Compensation | Source: Ambulatory Visit | Attending: Specialist | Admitting: Specialist

## 2015-03-10 DIAGNOSIS — M545 Low back pain, unspecified: Secondary | ICD-10-CM

## 2015-03-10 DIAGNOSIS — G709 Myoneural disorder, unspecified: Secondary | ICD-10-CM | POA: Diagnosis not present

## 2015-03-10 DIAGNOSIS — M4806 Spinal stenosis, lumbar region: Secondary | ICD-10-CM | POA: Diagnosis not present

## 2015-03-10 DIAGNOSIS — M549 Dorsalgia, unspecified: Secondary | ICD-10-CM | POA: Diagnosis present

## 2015-03-10 DIAGNOSIS — I10 Essential (primary) hypertension: Secondary | ICD-10-CM | POA: Diagnosis not present

## 2015-03-10 DIAGNOSIS — M48061 Spinal stenosis, lumbar region without neurogenic claudication: Secondary | ICD-10-CM | POA: Diagnosis present

## 2015-03-10 DIAGNOSIS — M5126 Other intervertebral disc displacement, lumbar region: Secondary | ICD-10-CM | POA: Diagnosis not present

## 2015-03-10 DIAGNOSIS — Z6831 Body mass index (BMI) 31.0-31.9, adult: Secondary | ICD-10-CM | POA: Insufficient documentation

## 2015-03-10 HISTORY — PX: LUMBAR LAMINECTOMY/DECOMPRESSION MICRODISCECTOMY: SHX5026

## 2015-03-10 SURGERY — LUMBAR LAMINECTOMY/DECOMPRESSION MICRODISCECTOMY 1 LEVEL
Anesthesia: General | Site: Back | Laterality: Left

## 2015-03-10 MED ORDER — GLYCOPYRROLATE 0.2 MG/ML IJ SOLN
INTRAMUSCULAR | Status: DC | PRN
  Administered 2015-03-10: 0.6 mg via INTRAVENOUS

## 2015-03-10 MED ORDER — MIDAZOLAM HCL 2 MG/2ML IJ SOLN
INTRAMUSCULAR | Status: AC
  Filled 2015-03-10: qty 4

## 2015-03-10 MED ORDER — OXYCODONE-ACETAMINOPHEN 5-325 MG PO TABS: 1.0000 | ORAL_TABLET | ORAL | Status: AC | PRN

## 2015-03-10 MED ORDER — METHOCARBAMOL 1000 MG/10ML IJ SOLN
500.0000 mg | Freq: Four times a day (QID) | INTRAVENOUS | Status: DC | PRN
  Administered 2015-03-10: 500 mg via INTRAVENOUS
  Filled 2015-03-10 (×2): qty 5

## 2015-03-10 MED ORDER — ACETAMINOPHEN 160 MG/5ML PO SOLN: 325.0000 mg | ORAL | Status: DC | PRN

## 2015-03-10 MED ORDER — LACTATED RINGERS IV SOLN
INTRAVENOUS | Status: DC
  Administered 2015-03-10 (×2): via INTRAVENOUS

## 2015-03-10 MED ORDER — ONDANSETRON HCL 4 MG/2ML IJ SOLN
4.0000 mg | INTRAMUSCULAR | Status: DC | PRN
  Administered 2015-03-10: 4 mg via INTRAVENOUS
  Filled 2015-03-10: qty 2

## 2015-03-10 MED ORDER — DOCUSATE SODIUM 100 MG PO CAPS
100.0000 mg | ORAL_CAPSULE | Freq: Two times a day (BID) | ORAL | Status: DC
  Administered 2015-03-10 – 2015-03-12 (×5): 100 mg via ORAL

## 2015-03-10 MED ORDER — MENTHOL 3 MG MT LOZG: 1.0000 | LOZENGE | OROMUCOSAL | Status: DC | PRN

## 2015-03-10 MED ORDER — METHOCARBAMOL 500 MG PO TABS: 500.0000 mg | ORAL_TABLET | Freq: Three times a day (TID) | ORAL | Status: AC | PRN

## 2015-03-10 MED ORDER — PHENOL 1.4 % MT LIQD: 1.0000 | OROMUCOSAL | Status: DC | PRN

## 2015-03-10 MED ORDER — FENTANYL CITRATE (PF) 250 MCG/5ML IJ SOLN
INTRAMUSCULAR | Status: AC
  Filled 2015-03-10: qty 25

## 2015-03-10 MED ORDER — SODIUM CHLORIDE 0.9 % IR SOLN
Status: AC
  Filled 2015-03-10: qty 1

## 2015-03-10 MED ORDER — GABAPENTIN 300 MG PO CAPS: 300.0000 mg | ORAL_CAPSULE | Freq: Three times a day (TID) | ORAL | Status: AC

## 2015-03-10 MED ORDER — ONDANSETRON HCL 4 MG/2ML IJ SOLN
INTRAMUSCULAR | Status: AC
  Filled 2015-03-10: qty 2

## 2015-03-10 MED ORDER — HYDROMORPHONE HCL 1 MG/ML IJ SOLN
0.2500 mg | INTRAMUSCULAR | Status: DC | PRN
  Administered 2015-03-10 (×2): 0.5 mg via INTRAVENOUS

## 2015-03-10 MED ORDER — PROPOFOL 10 MG/ML IV BOLUS
INTRAVENOUS | Status: AC
  Filled 2015-03-10: qty 20

## 2015-03-10 MED ORDER — PHENYLEPHRINE HCL 10 MG/ML IJ SOLN
INTRAMUSCULAR | Status: DC | PRN
  Administered 2015-03-10: 40 ug via INTRAVENOUS
  Administered 2015-03-10: 80 ug via INTRAVENOUS
  Administered 2015-03-10 (×2): 40 ug via INTRAVENOUS
  Administered 2015-03-10 (×3): 80 ug via INTRAVENOUS
  Administered 2015-03-10: 40 ug via INTRAVENOUS

## 2015-03-10 MED ORDER — DOCUSATE SODIUM 100 MG PO CAPS: 100.0000 mg | ORAL_CAPSULE | Freq: Two times a day (BID) | ORAL | Status: AC | PRN

## 2015-03-10 MED ORDER — NEOSTIGMINE METHYLSULFATE 10 MG/10ML IV SOLN
INTRAVENOUS | Status: DC | PRN
  Administered 2015-03-10: 5 mg via INTRAVENOUS

## 2015-03-10 MED ORDER — MAGNESIUM CITRATE PO SOLN: 1.0000 | Freq: Once | ORAL | Status: DC | PRN

## 2015-03-10 MED ORDER — ACETAMINOPHEN 650 MG RE SUPP: 650.0000 mg | RECTAL | Status: DC | PRN

## 2015-03-10 MED ORDER — FENTANYL CITRATE (PF) 100 MCG/2ML IJ SOLN
INTRAMUSCULAR | Status: DC | PRN
  Administered 2015-03-10 (×5): 50 ug via INTRAVENOUS

## 2015-03-10 MED ORDER — BISACODYL 5 MG PO TBEC: 5.0000 mg | DELAYED_RELEASE_TABLET | Freq: Every day | ORAL | Status: DC | PRN

## 2015-03-10 MED ORDER — GABAPENTIN 300 MG PO CAPS
300.0000 mg | ORAL_CAPSULE | Freq: Three times a day (TID) | ORAL | Status: DC
  Administered 2015-03-10 – 2015-03-12 (×7): 300 mg via ORAL
  Filled 2015-03-10 (×8): qty 1

## 2015-03-10 MED ORDER — ALUM & MAG HYDROXIDE-SIMETH 200-200-20 MG/5ML PO SUSP: 30.0000 mL | Freq: Four times a day (QID) | ORAL | Status: DC | PRN

## 2015-03-10 MED ORDER — BUPIVACAINE-EPINEPHRINE (PF) 0.5% -1:200000 IJ SOLN
INTRAMUSCULAR | Status: AC
  Filled 2015-03-10: qty 30

## 2015-03-10 MED ORDER — OXYCODONE-ACETAMINOPHEN 5-325 MG PO TABS
1.0000 | ORAL_TABLET | ORAL | Status: DC | PRN
  Administered 2015-03-10 – 2015-03-12 (×8): 2 via ORAL
  Administered 2015-03-12: 1 via ORAL
  Filled 2015-03-10 (×8): qty 2
  Filled 2015-03-10: qty 1

## 2015-03-10 MED ORDER — PHENYLEPHRINE 40 MCG/ML (10ML) SYRINGE FOR IV PUSH (FOR BLOOD PRESSURE SUPPORT)
PREFILLED_SYRINGE | INTRAVENOUS | Status: AC
  Filled 2015-03-10: qty 10

## 2015-03-10 MED ORDER — ROCURONIUM BROMIDE 100 MG/10ML IV SOLN
INTRAVENOUS | Status: AC
  Filled 2015-03-10: qty 1

## 2015-03-10 MED ORDER — CLINDAMYCIN PHOSPHATE 900 MG/50ML IV SOLN
INTRAVENOUS | Status: AC
  Filled 2015-03-10: qty 50

## 2015-03-10 MED ORDER — KCL IN DEXTROSE-NACL 20-5-0.45 MEQ/L-%-% IV SOLN
INTRAVENOUS | Status: AC
  Administered 2015-03-10: 50 mL/h via INTRAVENOUS
  Administered 2015-03-11: 09:00:00 via INTRAVENOUS
  Filled 2015-03-10 (×2): qty 1000

## 2015-03-10 MED ORDER — ACETAMINOPHEN 325 MG PO TABS: 325.0000 mg | ORAL_TABLET | ORAL | Status: DC | PRN

## 2015-03-10 MED ORDER — SENNOSIDES-DOCUSATE SODIUM 8.6-50 MG PO TABS: 1.0000 | ORAL_TABLET | Freq: Every evening | ORAL | Status: DC | PRN

## 2015-03-10 MED ORDER — CLINDAMYCIN PHOSPHATE 900 MG/50ML IV SOLN
900.0000 mg | INTRAVENOUS | Status: AC
  Administered 2015-03-10: 900 mg via INTRAVENOUS

## 2015-03-10 MED ORDER — MIDAZOLAM HCL 5 MG/5ML IJ SOLN
INTRAMUSCULAR | Status: DC | PRN
  Administered 2015-03-10: 2 mg via INTRAVENOUS

## 2015-03-10 MED ORDER — PROMETHAZINE HCL 25 MG/ML IJ SOLN
6.2500 mg | Freq: Four times a day (QID) | INTRAMUSCULAR | Status: DC | PRN
  Administered 2015-03-10: 12.5 mg via INTRAVENOUS
  Filled 2015-03-10: qty 1

## 2015-03-10 MED ORDER — PROPOFOL 10 MG/ML IV BOLUS
INTRAVENOUS | Status: DC | PRN
  Administered 2015-03-10: 150 mg via INTRAVENOUS

## 2015-03-10 MED ORDER — BUPIVACAINE-EPINEPHRINE 0.5% -1:200000 IJ SOLN
INTRAMUSCULAR | Status: DC | PRN
  Administered 2015-03-10: 13 mL

## 2015-03-10 MED ORDER — NEOSTIGMINE METHYLSULFATE 10 MG/10ML IV SOLN
INTRAVENOUS | Status: AC
  Filled 2015-03-10: qty 1

## 2015-03-10 MED ORDER — RISAQUAD PO CAPS
1.0000 | ORAL_CAPSULE | Freq: Every day | ORAL | Status: DC
  Administered 2015-03-10 – 2015-03-12 (×3): 1 via ORAL
  Filled 2015-03-10 (×3): qty 1

## 2015-03-10 MED ORDER — CEFAZOLIN SODIUM-DEXTROSE 2-3 GM-% IV SOLR
2.0000 g | Freq: Three times a day (TID) | INTRAVENOUS | Status: AC
  Administered 2015-03-10 – 2015-03-11 (×3): 2 g via INTRAVENOUS
  Filled 2015-03-10 (×3): qty 50

## 2015-03-10 MED ORDER — OXYCODONE HCL 5 MG PO TABS: 5.0000 mg | ORAL_TABLET | Freq: Once | ORAL | Status: DC | PRN

## 2015-03-10 MED ORDER — THROMBIN 5000 UNITS EX SOLR
CUTANEOUS | Status: AC
  Filled 2015-03-10: qty 10000

## 2015-03-10 MED ORDER — GELATIN ABSORBABLE MT POWD
OROMUCOSAL | Status: DC | PRN
  Administered 2015-03-10: 09:00:00 via TOPICAL

## 2015-03-10 MED ORDER — CEFAZOLIN SODIUM-DEXTROSE 2-3 GM-% IV SOLR
INTRAVENOUS | Status: AC
  Filled 2015-03-10: qty 50

## 2015-03-10 MED ORDER — CEFAZOLIN SODIUM-DEXTROSE 2-3 GM-% IV SOLR
2.0000 g | INTRAVENOUS | Status: AC
  Administered 2015-03-10: 2 g via INTRAVENOUS

## 2015-03-10 MED ORDER — ROCURONIUM BROMIDE 100 MG/10ML IV SOLN
INTRAVENOUS | Status: DC | PRN
  Administered 2015-03-10: 50 mg via INTRAVENOUS

## 2015-03-10 MED ORDER — HYDROMORPHONE HCL 1 MG/ML IJ SOLN
0.5000 mg | INTRAMUSCULAR | Status: DC | PRN
  Administered 2015-03-10: 0.5 mg via INTRAVENOUS
  Filled 2015-03-10: qty 1

## 2015-03-10 MED ORDER — ACETAMINOPHEN 325 MG PO TABS
650.0000 mg | ORAL_TABLET | ORAL | Status: DC | PRN
  Administered 2015-03-11: 650 mg via ORAL
  Filled 2015-03-10: qty 2

## 2015-03-10 MED ORDER — HYDROMORPHONE HCL 1 MG/ML IJ SOLN
INTRAMUSCULAR | Status: AC
  Administered 2015-03-10: 0.5 mg via INTRAVENOUS
  Filled 2015-03-10: qty 1

## 2015-03-10 MED ORDER — GLYCOPYRROLATE 0.2 MG/ML IJ SOLN
INTRAMUSCULAR | Status: AC
  Filled 2015-03-10: qty 3

## 2015-03-10 MED ORDER — OXYCODONE HCL 5 MG/5ML PO SOLN: 5.0000 mg | Freq: Once | ORAL | Status: DC | PRN

## 2015-03-10 MED ORDER — METHOCARBAMOL 500 MG PO TABS
500.0000 mg | ORAL_TABLET | Freq: Four times a day (QID) | ORAL | Status: DC | PRN
  Administered 2015-03-10 – 2015-03-12 (×4): 500 mg via ORAL
  Filled 2015-03-10 (×4): qty 1

## 2015-03-10 MED ORDER — HYDROCODONE-ACETAMINOPHEN 5-325 MG PO TABS
1.0000 | ORAL_TABLET | ORAL | Status: DC | PRN
  Administered 2015-03-10: 2 via ORAL
  Administered 2015-03-10 (×2): 1 via ORAL
  Filled 2015-03-10: qty 2
  Filled 2015-03-10 (×2): qty 1

## 2015-03-10 MED ORDER — SODIUM CHLORIDE 0.9 % IR SOLN
Status: DC | PRN
  Administered 2015-03-10: 500 mL

## 2015-03-10 MED ORDER — ONDANSETRON HCL 4 MG/2ML IJ SOLN
INTRAMUSCULAR | Status: DC | PRN
  Administered 2015-03-10: 4 mg via INTRAVENOUS

## 2015-03-10 SURGICAL SUPPLY — 47 items
BAG ZIPLOCK 12X15 (MISCELLANEOUS) IMPLANT
CLEANER TIP ELECTROSURG 2X2 (MISCELLANEOUS) ×3 IMPLANT
CLOSURE WOUND 1/2 X4 (GAUZE/BANDAGES/DRESSINGS) ×1
CLOTH 2% CHLOROHEXIDINE 3PK (PERSONAL CARE ITEMS) ×3 IMPLANT
DRAPE MICROSCOPE LEICA (MISCELLANEOUS) ×3 IMPLANT
DRAPE POUCH INSTRU U-SHP 10X18 (DRAPES) ×3 IMPLANT
DRAPE SHEET LG 3/4 BI-LAMINATE (DRAPES) ×3 IMPLANT
DRAPE SURG 17X11 SM STRL (DRAPES) ×3 IMPLANT
DRAPE UTILITY XL STRL (DRAPES) ×3 IMPLANT
DRSG AQUACEL AG ADV 3.5X 4 (GAUZE/BANDAGES/DRESSINGS) ×3 IMPLANT
DRSG AQUACEL AG ADV 3.5X 6 (GAUZE/BANDAGES/DRESSINGS) IMPLANT
DRSG TELFA 3X8 NADH (GAUZE/BANDAGES/DRESSINGS) ×3 IMPLANT
DURAPREP 26ML APPLICATOR (WOUND CARE) ×3 IMPLANT
DURASEAL SPINE SEALANT 3ML (MISCELLANEOUS) IMPLANT
ELECT BLADE TIP CTD 4 INCH (ELECTRODE) IMPLANT
ELECT REM PT RETURN 9FT ADLT (ELECTROSURGICAL) ×3
ELECTRODE REM PT RTRN 9FT ADLT (ELECTROSURGICAL) ×1 IMPLANT
GLOVE BIOGEL PI IND STRL 7.0 (GLOVE) ×1 IMPLANT
GLOVE BIOGEL PI INDICATOR 7.0 (GLOVE) ×2
GLOVE SURG SS PI 7.5 STRL IVOR (GLOVE) ×3 IMPLANT
GLOVE SURG SS PI 8.0 STRL IVOR (GLOVE) ×6 IMPLANT
GOWN STRL REUS W/TWL XL LVL3 (GOWN DISPOSABLE) ×6 IMPLANT
IV CATH 14GX2 1/4 (CATHETERS) ×3 IMPLANT
KIT BASIN OR (CUSTOM PROCEDURE TRAY) ×3 IMPLANT
KIT POSITIONING SURG ANDREWS (MISCELLANEOUS) ×3 IMPLANT
MANIFOLD NEPTUNE II (INSTRUMENTS) ×3 IMPLANT
NEEDLE SPNL 18GX3.5 QUINCKE PK (NEEDLE) ×6 IMPLANT
PACK LAMINECTOMY ORTHO (CUSTOM PROCEDURE TRAY) ×3 IMPLANT
PATTIES SURGICAL .5 X.5 (GAUZE/BANDAGES/DRESSINGS) IMPLANT
PATTIES SURGICAL .75X.75 (GAUZE/BANDAGES/DRESSINGS) ×3 IMPLANT
PATTIES SURGICAL 1X1 (DISPOSABLE) IMPLANT
RUBBERBAND STERILE (MISCELLANEOUS) ×3 IMPLANT
SPONGE SURGIFOAM ABS GEL 100 (HEMOSTASIS) ×3 IMPLANT
STAPLER VISISTAT (STAPLE) IMPLANT
STRIP CLOSURE SKIN 1/2X4 (GAUZE/BANDAGES/DRESSINGS) ×2 IMPLANT
SUT NURALON 4 0 TR CR/8 (SUTURE) IMPLANT
SUT PROLENE 3 0 PS 2 (SUTURE) IMPLANT
SUT VIC AB 1 CT1 27 (SUTURE)
SUT VIC AB 1 CT1 27XBRD ANTBC (SUTURE) IMPLANT
SUT VIC AB 1-0 CT2 27 (SUTURE) ×3 IMPLANT
SUT VIC AB 2-0 CT1 27 (SUTURE)
SUT VIC AB 2-0 CT1 TAPERPNT 27 (SUTURE) IMPLANT
SUT VIC AB 2-0 CT2 27 (SUTURE) ×3 IMPLANT
SYR 3ML LL SCALE MARK (SYRINGE) IMPLANT
TOWEL OR 17X26 10 PK STRL BLUE (TOWEL DISPOSABLE) ×3 IMPLANT
TOWEL OR NON WOVEN STRL DISP B (DISPOSABLE) IMPLANT
YANKAUER SUCT BULB TIP NO VENT (SUCTIONS) IMPLANT

## 2015-03-10 NOTE — Anesthesia Postprocedure Evaluation (Signed)
  Anesthesia Post-op Note  Patient: Jasmin Santana  Procedure(s) Performed: Procedure(s): MICRO LUMBAR DECOMPRESSION L4-5 ON LEFT (Left)  Patient Location: PACU  Anesthesia Type:General  Level of Consciousness: awake  Airway and Oxygen Therapy: Patient Spontanous Breathing and Patient connected to nasal cannula oxygen  Post-op Pain: mild  Post-op Assessment: Post-op Vital signs reviewed, Patient's Cardiovascular Status Stable, Respiratory Function Stable, Patent Airway, No signs of Nausea or vomiting and Pain level controlled              Post-op Vital Signs: Reviewed and stable  Last Vitals:  Filed Vitals:   03/10/15 1145  BP: 116/71  Pulse: 50  Temp: 36.6 C  Resp: 12    Complications: No apparent anesthesia complications

## 2015-03-10 NOTE — H&P (View-Only) (Signed)
Surgical screening results in epic per PAT visit 03/05/2015 positive for STAPH. Results sent to Dr Shelle Iron. Prescription for Mupriocin Ointment called to [facility] [address]; spoke with Herbert Seta / pharmacist. Pt notified and reviewed instructions (pt also has written instruction in regards to application) and instructed to bring medication with her day of surgery to continue as prescribed. Pt verbalized understanding.

## 2015-03-10 NOTE — Anesthesia Procedure Notes (Signed)
Procedure Name: Intubation Date/Time: 03/10/2015 8:53 AM Performed by: Ludwig Lean Pre-anesthesia Checklist: Patient identified, Emergency Drugs available, Suction available and Patient being monitored Patient Re-evaluated:Patient Re-evaluated prior to inductionOxygen Delivery Method: Circle System Utilized Preoxygenation: Pre-oxygenation with 100% oxygen Intubation Type: IV induction Ventilation: Mask ventilation without difficulty Laryngoscope Size: Mac and 4 Grade View: Grade I Tube type: Oral Number of attempts: 1 Airway Equipment and Method: Oral airway Placement Confirmation: ETT inserted through vocal cords under direct vision,  positive ETCO2 and breath sounds checked- equal and bilateral Secured at: 19 cm Tube secured with: Tape Dental Injury: Teeth and Oropharynx as per pre-operative assessment  Comments: Small nick to upper right lip.

## 2015-03-10 NOTE — Discharge Instructions (Signed)
Walk As Tolerated utilizing back precautions.  No bending, twisting, or lifting.  No driving for 2 weeks.   °Aquacel dressing may remain in place until follow up. May shower with aquacel dressing in place. If the dressing peels off or becomes saturated, you may remove aquacel dressing and place gauze and tape dressing which should be kept clean and dry and changed daily. Do not remove steri-strips if they are present. °See Dr. Piera Downs in office in 10 to 14 days. Begin taking aspirin 81mg per day starting 4 days after your surgery if not allergic to aspirin or on another blood thinner. °Walk daily even outside. Use a cane or walker only if necessary. °Avoid sitting on soft sofas. ° °

## 2015-03-10 NOTE — Brief Op Note (Signed)
03/10/2015  10:05 AM  PATIENT:  Jasmin Santana  44 y.o. female  PRE-OPERATIVE DIAGNOSIS:  HNP L4-5 ON LEFT  POST-OPERATIVE DIAGNOSIS:  HNP L4-5 ON LEFT  PROCEDURE:  Procedure(s): MICRO LUMBAR DECOMPRESSION L4-5 ON LEFT (Left)  SURGEON:  Surgeon(s) and Role:    * Jene Every, MD - Primary  PHYSICIAN ASSISTANT:   ASSISTANTS: Bissell   ANESTHESIA:   general  EBL:     BLOOD ADMINISTERED:none  DRAINS: none   LOCAL MEDICATIONS USED:  MARCAINE     SPECIMEN:  Source of Specimen:  [id number]  DISPOSITION OF SPECIMEN:  PATHOLOGY  COUNTS:  YES  TOURNIQUET:  * No tourniquets in log *  DICTATION: .Other Dictation: Dictation Number [id number]  PLAN OF CARE: Admit for overnight observation  PATIENT DISPOSITION:  PACU - hemodynamically stable.   Delay start of Pharmacological VTE agent (>24hrs) due to surgical blood loss or risk of bleeding: yes

## 2015-03-10 NOTE — Interval H&P Note (Signed)
History and Physical Interval Note:  03/10/2015 7:52 AM  Jasmin Santana  has presented today for surgery, with the diagnosis of HNP L4-5 ON LEFT  The various methods of treatment have been discussed with the patient and family. After consideration of risks, benefits and other options for treatment, the patient has consented to  Procedure(s): MICRO LUMBAR DECOMPRESSION L4-5 ON LEFT (Left) as a surgical intervention .  The patient's history has been reviewed, patient examined, no change in status, stable for surgery.  I have reviewed the patient's chart and labs.  Questions were answered to the patient's satisfaction.     BEANE,JEFFREY C

## 2015-03-10 NOTE — Op Note (Signed)
NAMEAUDRE, CENCI NO.:  [id number]  MEDICAL RECORD NO.:  [id number]  LOCATION:  [facility]                         FACILITY:  [facility]  PHYSICIAN:  Jene Every, M.D.    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  POSTOPERATIVE DIAGNOSES:  Herniated nucleus pulposus, spinal stenosis, L4-5, left.  PROCEDURES PERFORMED: 1. Microlumbar decompression L4-5, left. 2. Foraminotomies, L4-L5 left. 3. Microdiskectomy L4-5, left.  ANESTHESIA:  General.  ASSISTANT:  Lanna Poche, PA.  HISTORY:  This is a pleasant 44 year old female with left lower extremity radicular pain secondary to disk herniation at L4-5 lateral and foraminal.  She had L4 and L5 nerve root radiculopathy, quad weakness and EHL weakness.  MRI confirmed the disk herniation. Refractory to conservative treatment, was indicated for decompression by diskectomy and lateral recess decompression decompressing the L4 and L5 nerve roots.  Risks and benefits were discussed including bleeding, infection, damage to neurovascular structures, DVT, PE, anesthetic complications, etc.  TECHNIQUE:  With the patient in a supine position, after induction of adequate anesthesia 2 g Kefzol and 900 clindamycin, placed prone on the [place] frame.  All bony prominences were well padded.  Lumbar region was prepped and draped in usual sterile fashion.  Two 18-gauge spinal needle was utilized to localize L4-5 interspace, confirmed with x-ray. Incision was made from spinous process 4-5, subcutaneous tissue was dissected.  Electrocautery was utilized to achieve hemostasis. Dorsolumbar fascia was identified and divided in line with skin incision.  Paraspinous muscle was elevated from lamina of 4 and 5.  We slightly extended the incision cephalad due to the  foraminal disk herniation requiring an augmented interlaminar window.  Marcaine 0.25% with epinephrine had been infiltrated in the perimuscular tissues. McCullough retractor was placed.  Operating microscope was draped and brought on the surgical field after confirmatory radiograph confirmed L4- 5.  First hemilaminotomy of the caudad edge of L4 was performed with a 2 and then 3 mm Kerrison to the point of attachment of the ligamentum flavum preserving the pars.  A straight curette was utilized to detach ligamentum flavum from the cephalad edge of 5.  Next, Penfield 4 followed by neuro patty was placed beneath the ligamentum flavum to protect the neural elements.  I then performed a foraminotomy of L5. Identified the 5 root and gently mobilized it medially.  I then decompressed the lateral recess to the medial border of pedicle, slight facet and ligamentum flavum hypertrophy.  We extended cephalad to identify a focal disk herniation lateral extending into the foramen. Slight extrusion was noted.  After protecting the neural elements with neuro patties, I performed an annulotomy at the extrusion and multiple fragments were removed from the disk space with a micro and straight pituitary from both sides of the operating room table further mobilizing with an Epstein and nerve hook.  Multiple fragments were excised.  The disk herniation was fairly degenerated.  After multiple fragments were removed, there was some continued degenerative disk material extruding from the annulotomy, this was fully removed in its  entirety.  Following this, I irrigated the disk space with antibiotic catheter irrigation. One additional fragment was displaced and removed.  We then obtained a confirmatory radiograph.  Following this, a neural probe passed freely up the foramen of 4 and 5.  There was 1 cm excursion of the 5 root medial to the pedicle without tension.  Bipolar electrocautery had been utilized to achieve  hemostasis.  No active bleeding or CSF leakage was noted.  We therefore copiously irrigated the wound.  We removed the [facility] retractor, copiously irrigated the paraspinous musculature. No active bleeding.  We closed the fascia with 1-Vicryl, subcu with 2-0, and skin and subcuticular with Prolene.  Sterile dressing applied. Placed supine on the hospital bed, extubated without difficulty, and transported to the recovery room in a satisfactory condition.  The patient tolerated the procedure well.  No complications.  Assistant, Lanna Poche, PA was used throughout the case in the patient positioning, gentle intermittent neural traction, and closure.  No blood loss.     Jene Every, M.D.     Cordelia Pen  D:  03/10/2015  T:  03/10/2015  Job:  [id number]

## 2015-03-10 NOTE — Evaluation (Signed)
Physical Therapy Evaluation Patient Details Name: Jasmin Santana MRN: [id number] DOB: 1970/10/05 Today's Date: 03/10/2015   History of Present Illness  Pt s/p L4-5 microlumbar decompression  Clinical Impression  Pt s/p micro-lumbar decompression presents with functional mobility limitations 2* post op pain and nausea, back precautions, and ambulatory instability.  Pt should progress to dc home with family assist.    Follow Up Recommendations No PT follow up    Equipment Recommendations  Rolling walker with 5" wheels    Recommendations for Other Services OT consult     Precautions / Restrictions Precautions Precautions: Back;Fall Restrictions Weight Bearing Restrictions: No      Mobility  Bed Mobility Overal bed mobility: Needs Assistance Bed Mobility: Supine to Sit;Sit to Supine     Supine to sit: Mod assist Sit to supine: Min assist;Mod assist   General bed mobility comments: cues for sequence and log roll technique; physical assist to manage trunk and bring LEs on/off bed  Transfers Overall transfer level: Needs assistance Equipment used: None Transfers: Sit to/from Stand Sit to Stand: Min assist;Mod assist         General transfer comment: cues for transition position, use of UEs to self assist and cues for adherence to back precautions  Ambulation/Gait Ambulation/Gait assistance: Min assist;Mod assist;+2 physical assistance Ambulation Distance (Feet): 85 Feet (and 2x 15' to/from bathroom) Assistive device: Rolling walker (2 wheeled) Gait Pattern/deviations: Step-through pattern;Decreased step length - right;Decreased step length - left;Shuffle;Trunk flexed Gait velocity: decr   General Gait Details: cues for posture and position from RW; assist for stability and balance with onset of N&V  Stairs            Wheelchair Mobility    Modified Rankin (Stroke Patients Only)       Balance                                              Pertinent Vitals/Pain Pain Assessment: 0-10 Pain Score: 6  Pain Location: low back Pain Descriptors / Indicators: Aching;Sore Pain Intervention(s): Limited activity within patient's tolerance;Monitored during session;Premedicated before session    Home Living Family/patient expects to be discharged to:: Private residence Living Arrangements: Spouse/significant other;Children Available Help at Discharge: Family Type of Home: House Home Access: Ramped entrance     Home Layout: One level Home Equipment: None      Prior Function Level of Independence: Independent               Hand Dominance        Extremity/Trunk Assessment   Upper Extremity Assessment: Overall WFL for tasks assessed           Lower Extremity Assessment: Generalized weakness         Communication   Communication: No difficulties  Cognition Arousal/Alertness: Awake/alert Behavior During Therapy: WFL for tasks assessed/performed Overall Cognitive Status: Within Functional Limits for tasks assessed                      General Comments      Exercises        Assessment/Plan    PT Assessment Patient needs continued PT services  PT Diagnosis Difficulty walking   PT Problem List Decreased strength;Decreased range of motion;Decreased activity tolerance;Decreased coordination;Decreased knowledge of use of DME;Pain;Decreased knowledge of precautions  PT Treatment Interventions DME instruction;Gait training;Stair training;Functional mobility training;Therapeutic activities;Therapeutic  exercise;Patient/family education   PT Goals (Current goals can be found in the Care Plan section) Acute Rehab PT Goals Patient Stated Goal: Less pain PT Goal Formulation: With patient Time For Goal Achievement: 03/13/15 Potential to Achieve Goals: Good    Frequency Min 6X/week   Barriers to discharge        Co-evaluation               End of Session   Activity Tolerance: Patient  tolerated treatment well Patient left: in bed;with call bell/phone within reach Nurse Communication: Mobility status    Functional Assessment Tool Used: clinical judgement Functional Limitation: Mobility: Walking and moving around Mobility: Walking and Moving Around Current Status (E4540): At least 20 percent but less than 40 percent impaired, limited or restricted Mobility: Walking and Moving Around Goal Status [phone]): At least 1 percent but less than 20 percent impaired, limited or restricted    Time: 1478-2956 PT Time Calculation (min) (ACUTE ONLY): 36 min   Charges:   PT Evaluation $Initial PT Evaluation Tier I: 1 Procedure PT Treatments $Gait Training: 8-22 mins   PT G Codes:   PT G-Codes **NOT FOR INPATIENT CLASS** Functional Assessment Tool Used: clinical judgement Functional Limitation: Mobility: Walking and moving around Mobility: Walking and Moving Around Current Status (O1308): At least 20 percent but less than 40 percent impaired, limited or restricted Mobility: Walking and Moving Around Goal Status [phone]): At least 1 percent but less than 20 percent impaired, limited or restricted    [facility] 03/10/2015, 4:23 PM

## 2015-03-10 NOTE — Anesthesia Preprocedure Evaluation (Signed)
Anesthesia Evaluation  Patient identified by MRN, date of birth, ID band Patient awake    Reviewed: Allergy & Precautions, NPO status , Patient's Chart, lab work & pertinent test results  History of Anesthesia Complications Negative for: history of anesthetic complications  Airway Mallampati: III  TM Distance: >3 FB Neck ROM: Full    Dental  (+) Teeth Intact,    Pulmonary neg pulmonary ROS,    breath sounds clear to auscultation       Cardiovascular hypertension, Pt. on medications (-) angina+CHF  (-) Past MI  Rhythm:Regular     Neuro/Psych neg Seizures  Neuromuscular disease negative psych ROS   GI/Hepatic negative GI ROS, Neg liver ROS,   Endo/Other  Morbid obesity  Renal/GU negative Renal ROS     Musculoskeletal negative musculoskeletal ROS (+)   Abdominal   Peds  Hematology negative hematology ROS (+)   Anesthesia Other Findings   Reproductive/Obstetrics                             Anesthesia Physical Anesthesia Plan  ASA: II  Anesthesia Plan: General   Post-op Pain Management:    Induction: Intravenous  Airway Management Planned: Oral ETT  Additional Equipment: None  Intra-op Plan:   Post-operative Plan: Extubation in OR  Informed Consent: I have reviewed the patients History and Physical, chart, labs and discussed the procedure including the risks, benefits and alternatives for the proposed anesthesia with the patient or authorized representative who has indicated his/her understanding and acceptance.   Dental advisory given  Plan Discussed with: CRNA and Surgeon  Anesthesia Plan Comments:         Anesthesia Quick Evaluation

## 2015-03-10 NOTE — Transfer of Care (Signed)
Immediate Anesthesia Transfer of Care Note  Patient: Jasmin Santana  Procedure(s) Performed: Procedure(s): MICRO LUMBAR DECOMPRESSION L4-5 ON LEFT (Left)  Patient Location: PACU  Anesthesia Type:General  Level of Consciousness: Patient easily awoken, sedated, comfortable, cooperative, following commands, responds to stimulation.   Airway & Oxygen Therapy: Patient spontaneously breathing, ventilating well, oxygen via simple oxygen mask.  Post-op Assessment: Report given to PACU RN, vital signs reviewed and stable, moving all extremities.   Post vital signs: Reviewed and stable.  Complications: No apparent anesthesia complications

## 2015-03-11 DIAGNOSIS — M5126 Other intervertebral disc displacement, lumbar region: Secondary | ICD-10-CM | POA: Diagnosis not present

## 2015-03-11 LAB — BASIC METABOLIC PANEL
ANION GAP: 4 — AB (ref 5–15)
BUN: 10 mg/dL (ref 6–20)
CALCIUM: 8.6 mg/dL — AB (ref 8.9–10.3)
CO2: 28 mmol/L (ref 22–32)
Chloride: 106 mmol/L (ref 101–111)
Creatinine, Ser: 0.96 mg/dL (ref 0.44–1.00)
GFR calc non Af Amer: >60 mL/min (ref >60)
Glucose, Bld: 110 mg/dL — ABNORMAL HIGH (ref 65–99)
Potassium: 4.2 mmol/L (ref 3.5–5.1)
Sodium: 138 mmol/L (ref 135–145)

## 2015-03-11 MED ORDER — IBUPROFEN 800 MG PO TABS: 800.0000 mg | ORAL_TABLET | Freq: Three times a day (TID) | ORAL | Status: DC | PRN

## 2015-03-11 NOTE — Care Management Note (Addendum)
Case Management Note  Patient Details  Name: MIKAYLAH LIBBEY MRN: [id number] Date of Birth: 10/18/1970  Subjective/Objective:                  MICRO LUMBAR DECOMPRESSION L4-5 ON LEFT (Left)  Action/Plan:  Discharge planning Expected Discharge Date:  03/12/15               Expected Discharge Plan:     In-House Referral:     Discharge planning Services  CM Consult  Post Acute Care Choice:    Choice offered to:  NA  DME Arranged:3n1, rolling walker and transfer bench DME Agency:  Other - Comment  HH Arranged:    HH Agency:     Status of Service:  In process, will continue to follow  Medicare Important Message Given:    Date Medicare IM Given:    Medicare IM give by:    Date Additional Medicare IM Given:    Additional Medicare Important Message give by:     If discussed at [place] of Stay Meetings, dates discussed:    Additional Comments: CM met with pt and called [employer] [employer] (pt's attorney) for [facility] contact to arrange for DME.  CM called Claims Adjuster Christen Bame [phone] ext: [phone] and left message using pt's claim # [id number] acct# [id number] to please call me back to arrange for DME.  CM placed call again at 13:50 and message states system office closed for "System Wide Meeting" and to please call during regular business M-F.  CM was able to leave another message for callback but do not expect to hear back from [facility] until tomorrow.  CM made both pt and RN aware. Will continue to follow. Dellie Catholic, RN 03/11/2015, 1:54 PM

## 2015-03-11 NOTE — Progress Notes (Signed)
Subjective: 1 Day Post-Op Procedure(s) (LRB): MICRO LUMBAR DECOMPRESSION L4-5 ON LEFT (Left) Patient reports pain as moderate.  Reports incisional back pain and buttock pain. Pain in the leg is improved. Reports a tough night with nausea which is better today. Voiding well. At this point does not feel she is ready for D/C home today.  Objective: Vital signs in last 24 hours: Temp:  [97.7 F (36.5 C)-98.9 F (37.2 C)] 98.9 F (37.2 C) (09/22 0542) Pulse Rate:  [46-78] 53 (09/22 0542) Resp:  [12-18] 16 (09/22 0542) BP: (97-149)/(55-90) 101/55 mmHg (09/22 0542) SpO2:  [99 %-100 %] 100 % (09/22 0542) Weight:  [85.276 kg (188 lb)] 85.276 kg (188 lb) (09/21 1145)  Intake/Output from previous day: 09/21 0701 - 09/22 0700 In: 3395.8 [P.O.:1200; I.V.:2145.8; IV Piggyback:50] Out: -  Intake/Output this shift:    No results for input(s): HGB in the last 72 hours. No results for input(s): WBC, RBC, HCT, PLT in the last 72 hours.  Recent Labs  03/11/15 0543  NA 138  K 4.2  CL 106  CO2 28  BUN 10  CREATININE 0.96  GLUCOSE 110*  CALCIUM 8.6*   No results for input(s): LABPT, INR in the last 72 hours.  Neurologically intact ABD soft Neurovascular intact Sensation intact distally Intact pulses distally Dorsiflexion/Plantar flexion intact Incision: dressing C/D/I No cellulitis present Compartment soft no sign of DVT  Assessment/Plan: 1 Day Post-Op Procedure(s) (LRB): MICRO LUMBAR DECOMPRESSION L4-5 ON LEFT (Left) Advance diet Up with therapy D/C IV fluids  Discussed D/C instructions, precautions Possible D/C later today if pain improves otherwise plan for D/C in AM Seen by myself and Dr. Elissa Lovett, Dayna Barker. 03/11/2015, 9:57 AM

## 2015-03-11 NOTE — Evaluation (Addendum)
Occupational Therapy Evaluation Patient Details Name: Jasmin Santana MRN: [id number] DOB: 1970/07/05 Today's Date: 03/11/2015    History of Present Illness Pt s/p L4-5 microlumbar decompression   Clinical Impression   This 44 year old female was admitted for the above surgery.  Will follow in acute setting with supervision level goals for adls and reinforce AE/DME and back precautions. Pt was independent prior to admission. She needs overall min A at this time.      Follow Up Recommendations  No OT follow up;Supervision/Assistance - 24 hour    Equipment Recommendations  3 in 1 bedside comode, tub transfer bench,  AE kit (reacher, sock aide, long sponge, long shoehorn) and toilet aide)    Recommendations for Other Services       Precautions / Restrictions Precautions Precautions: Back;Fall Restrictions Weight Bearing Restrictions: No      Mobility Bed Mobility               General bed mobility comments: oob  Transfers   Equipment used: Rolling walker (2 wheeled) Transfers: Sit to/from Stand Sit to Stand: Min guard         General transfer comment: cues for back precautions    Balance                                            ADL Overall ADL's : Needs assistance/impaired     Grooming: Wash/dry hands;Supervision/safety;Standing   Upper Body Bathing: Supervision/ safety;Standing   Lower Body Bathing: Sit to/from stand;Minimal assistance (with AE)   Upper Body Dressing : Minimal assistance;Standing (due to iv)   Lower Body Dressing: Minimal assistance;Cueing for back precautions;With adaptive equipment;Sit to/from stand   Toilet Transfer: Min guard;Ambulation;BSC;RW   Toileting- Clothing Manipulation and Hygiene: Minimal assistance;Sit to/from stand         General ADL Comments: pt practiced with reacher and sock aide.  She would benefit from toilet aide also for toileting.  Pt wore hospital mesh underwear--did not use  reacher for these.  Pt verbalizes understanding of back precautions     Vision     Perception     Praxis      Pertinent Vitals/Pain Pain Score: 7  Pain Location: back Pain Descriptors / Indicators: Aching Pain Intervention(s): Limited activity within patient's tolerance;Monitored during session;Premedicated before session;Repositioned;Ice applied     Hand Dominance     Extremity/Trunk Assessment Upper Extremity Assessment Upper Extremity Assessment: Overall WFL for tasks assessed           Communication Communication Communication: No difficulties   Cognition Arousal/Alertness: Awake/alert Behavior During Therapy: WFL for tasks assessed/performed Overall Cognitive Status: Within Functional Limits for tasks assessed                     General Comments       Exercises       Shoulder Instructions      Home Living Family/patient expects to be discharged to:: Private residence Living Arrangements: Spouse/significant other;Children Available Help at Discharge: Family Type of Home: House Home Access: Ramped entrance     Home Layout: One level     Bathroom Shower/Tub: Teacher, early years/pre: Standard     Home Equipment: None   Additional Comments: pt removed shower curtain.  She was sitting at bottom of tub      Prior Functioning/Environment Level of Independence:  Independent             OT Diagnosis: Acute pain   OT Problem List: Decreased activity tolerance;Pain;Decreased knowledge of use of DME or AE;Decreased knowledge of precautions   OT Treatment/Interventions: Self-care/ADL training;DME and/or AE instruction;Patient/family education    OT Goals(Current goals can be found in the care plan section) Acute Rehab OT Goals Patient Stated Goal: Less pain OT Goal Formulation: With patient Time For Goal Achievement: 03/18/15 Potential to Achieve Goals: Good ADL Goals Pt Will Transfer to Toilet: with  supervision;ambulating;bedside commode Pt Will Perform Tub/Shower Transfer: Tub transfer;ambulating;tub bench;with supervision (vs verbalize) Additional ADL Goal #1: pt will complete LB adls with AE, supervision, sit to stand following bakc precautions  OT Frequency: Min 2X/week   Barriers to D/C:            Co-evaluation              End of Session    Activity Tolerance: Patient tolerated treatment well Patient left: in chair;with call bell/phone within reach   Time: 0947-1028 OT Time Calculation (min): 41 min Charges:  OT General Charges $OT Visit: 1 Procedure OT Evaluation $Initial OT Evaluation Tier I: 1 Procedure OT Treatments $Self Care/Home Management : 8-22 mins $Therapeutic Activity: 8-22 mins G-Codes: OT G-codes **NOT FOR INPATIENT CLASS** Functional Assessment Tool Used: clinical observation Functional Limitation: Self care Self Care Current Status (W3468): At least 20 percent but less than 40 percent impaired, limited or restricted Self Care Goal Status (K7373): At least 1 percent but less than 20 percent impaired, limited or restricted  [facility] 03/11/2015, 10:36 AM  Lesle Chris, OTR/L [phone] 03/11/2015

## 2015-03-11 NOTE — Progress Notes (Signed)
Physical Therapy Treatment Patient Details Name: Jasmin Santana: [id number] DOB: December 01, 1970 Today's Date: 03/11/2015    History of Present Illness Pt s/p L4-5 microlumbar decompression    PT Comments    Demonstrated and instructed on proper "log roll" tech to transfer from supine to EOB.  Instructed on back precautions and booklet given.  Assisted with amb in hallway and practicing stairs.   Instructed on proper positioning while supine and side lying in bed with use of pillows.  Instructed on use of ICE.  All mobility questions addressed.  Pt ready for D/C to home.    Follow Up Recommendations  No PT follow up     Equipment Recommendations  Rolling walker with 5" wheels    Recommendations for Other Services       Precautions / Restrictions Precautions Precautions: Back;Fall Precaution Booklet Issued: Yes (comment) Precaution Comments: educated on back precautions Restrictions Weight Bearing Restrictions: No    Mobility  Bed Mobility Overal bed mobility: Needs Assistance Bed Mobility: Rolling;Sit to Sidelying Rolling: Min guard       Sit to sidelying: Min guard General bed mobility comments: pt instructed on "log roll" tech  Transfers Overall transfer level: Needs assistance Equipment used: Rolling walker (2 wheeled) Transfers: Sit to/from Stand Sit to Stand: Min guard         General transfer comment: cues for back precautions and hand placement  Ambulation/Gait Ambulation/Gait assistance: Min guard Ambulation Distance (Feet): 75 Feet Assistive device: Rolling walker (2 wheeled) Gait Pattern/deviations: Step-through pattern;Decreased stride length Gait velocity: decr   General Gait Details: cues for posture and position from RW   Stairs Stairs: Yes Stairs assistance: Min guard Stair Management: Step to pattern Number of Stairs: 4 General stair comments: 25% VC's on proper sequencing and safety  Wheelchair Mobility    Modified Rankin  (Stroke Patients Only)       Balance                                    Cognition Arousal/Alertness: Awake/alert Behavior During Therapy: WFL for tasks assessed/performed Overall Cognitive Status: Within Functional Limits for tasks assessed                      Exercises      General Comments        Pertinent Vitals/Pain Pain Assessment: 0-10 Pain Score: 7  Pain Location: back Pain Descriptors / Indicators: Aching;Constant Pain Intervention(s): Monitored during session;Repositioned;Ice applied    Home Living Family/patient expects to be discharged to:: Private residence Living Arrangements: Spouse/significant other;Children Available Help at Discharge: Family Type of Home: House Home Access: Ramped entrance   Home Layout: One level Home Equipment: None Additional Comments: pt removed shower curtain.  She was sitting at bottom of tub    Prior Function Level of Independence: Independent          PT Goals (current goals can now be found in the care plan section) Acute Rehab PT Goals Patient Stated Goal: Less pain Progress towards PT goals: Progressing toward goals    Frequency  Min 6X/week    PT Plan      Co-evaluation             End of Session Equipment Utilized During Treatment: Gait belt Activity Tolerance: Patient tolerated treatment well Patient left: in chair;with call bell/phone within reach     Time: 0910-0940 PT Time  Calculation (min) (ACUTE ONLY): 30 min  Charges:  $Gait Training: 8-22 mins $Therapeutic Activity: 8-22 mins                    G Codes:      Rica Koyanagi  PTA [facility]  Acute  Rehab Pager      [phone]

## 2015-03-12 DIAGNOSIS — M5126 Other intervertebral disc displacement, lumbar region: Secondary | ICD-10-CM | POA: Diagnosis not present

## 2015-03-12 NOTE — Progress Notes (Signed)
Physical Therapy Treatment Patient Details Name: Jasmin Santana MRN: [id number] DOB: [birth year] Today's Date: 2015-04-09    History of Present Illness Pt s/p L4-5 microlumbar decompression    PT Comments    Pt progressing well with mobility and good awareness of back precautions.  Follow Up Recommendations  No PT follow up     Equipment Recommendations  Rolling walker with 5" wheels    Recommendations for Other Services OT consult     Precautions / Restrictions Precautions Precautions: Back;Fall Precaution Comments: Pt recalls all back precautions` Restrictions Weight Bearing Restrictions: No    Mobility  Bed Mobility Overal bed mobility: Needs Assistance Bed Mobility: Rolling;Sit to Supine;Sit to Sidelying Rolling: Supervision     Sit to supine: Supervision Sit to sidelying: Min guard General bed mobility comments: cues for complete roll onto shoulder but marked improvement noted  Transfers Overall transfer level: Needs assistance Equipment used: Rolling walker (2 wheeled) Transfers: Sit to/from Stand Sit to Stand: Supervision         General transfer comment: cues for back precautions and hand placement  Ambulation/Gait Ambulation/Gait assistance: Supervision Ambulation Distance (Feet): 150 Feet Assistive device: Rolling walker (2 wheeled) Gait Pattern/deviations: Step-through pattern;Decreased step length - right;Decreased step length - left;Shuffle;Trunk flexed Gait velocity: decr   General Gait Details: cues for posture and position from RW   Stairs         General stair comments: Pt states comfortable with stairs - declines to reattempt  Wheelchair Mobility    Modified Rankin (Stroke Patients Only)       Balance                                    Cognition Arousal/Alertness: Awake/alert Behavior During Therapy: WFL for tasks assessed/performed Overall Cognitive Status: Within Functional Limits for tasks assessed                       Exercises      General Comments        Pertinent Vitals/Pain Pain Assessment: 0-10 Pain Score: 4  Pain Location: back Pain Descriptors / Indicators: Aching;Sore;Tightness Pain Intervention(s): Limited activity within patient's tolerance;Monitored during session;Premedicated before session    Home Living                      Prior Function            PT Goals (current goals can now be found in the care plan section) Acute Rehab PT Goals Patient Stated Goal: Less pain PT Goal Formulation: With patient Time For Goal Achievement: 03/13/15 Potential to Achieve Goals: Good Progress towards PT goals: Progressing toward goals    Frequency  Min 6X/week    PT Plan Current plan remains appropriate    Co-evaluation             End of Session   Activity Tolerance: Patient tolerated treatment well Patient left: in bed;with call bell/phone within reach     Time: 3474-2595 PT Time Calculation (min) (ACUTE ONLY): 21 min  Charges:  $Gait Training: 8-22 mins                    G Codes:      Jacob Cicero 2015-04-09, 9:07 AM

## 2015-03-12 NOTE — Progress Notes (Signed)
Received a call from Synetta Fail ([phone]) who will be providing DME, equipment will be delivered today, unable to provide specific time.

## 2015-03-12 NOTE — Progress Notes (Signed)
Subjective: 2 Days Post-Op Procedure(s) (LRB): MICRO LUMBAR DECOMPRESSION L4-5 ON LEFT (Left) Patient reports pain as mild.  Reports incisional pain, improved, left lower back pain into buttock. Well controlled with pain meds. No leg pain. Minimal numbness intermittent L lower leg, improved from pre-op. Voiding without difficulty. Has a good appetite this AM, no nausea, feels ready for D/C home.  Objective: Vital signs in last 24 hours: Temp:  [97.8 F (36.6 C)-100.3 F (37.9 C)] 97.8 F (36.6 C) (09/23 0521) Pulse Rate:  [64-71] 64 (09/23 0521) Resp:  [16-20] 16 (09/23 0521) BP: (123-130)/(72-93) 123/72 mmHg (09/23 0521) SpO2:  [99 %-100 %] 99 % (09/23 0521)  Intake/Output from previous day: 09/22 0701 - 09/23 0700 In: 960 [P.O.:960] Out: -  Intake/Output this shift:    No results for input(s): HGB in the last 72 hours. No results for input(s): WBC, RBC, HCT, PLT in the last 72 hours.  Recent Labs  03/11/15 0543  NA 138  K 4.2  CL 106  CO2 28  BUN 10  CREATININE 0.96  GLUCOSE 110*  CALCIUM 8.6*   No results for input(s): LABPT, INR in the last 72 hours.  Neurologically intact ABD soft Neurovascular intact Sensation intact distally Intact pulses distally Dorsiflexion/Plantar flexion intact Incision: dressing C/D/I and no drainage No cellulitis present Compartment soft no sign of DVT  Assessment/Plan: 2 Days Post-Op Procedure(s) (LRB): MICRO LUMBAR DECOMPRESSION L4-5 ON LEFT (Left) Advance diet Up with therapy D/C IV fluids  Discussed D/C instructions, dressing instructions, Lspine precautions D/C home today Follow up in office in 2 weeks for suture removal Will discuss with Dr. Elissa Lovett, Dayna Barker. 03/12/2015, 7:57 AM

## 2015-03-12 NOTE — Discharge Summary (Signed)
Physician Discharge Summary   Patient ID: Jasmin Santana MRN: [id number] DOB/AGE: Mar 22, 1971 44 y.o.  Admit date: 03/10/2015 Discharge date: 03/12/2015  Primary Diagnosis:   HNP L4-5 ON LEFT  Admission Diagnoses:  Past Medical History  Diagnosis Date  . Hypertension   . Numbness and tingling     from left hip down/both feet bilat   . Wears glasses    Discharge Diagnoses:   Principal Problem:   HNP (herniated nucleus pulposus), lumbar Active Problems:   Spinal stenosis of lumbar region  Procedure:  Procedure(s) (LRB): MICRO LUMBAR DECOMPRESSION L4-5 ON LEFT (Left)   Consults: None  HPI:  see H&P    Laboratory Data: Hospital Outpatient Visit on 03/05/2015  Component Date Value Ref Range Status  . Preg, Serum 03/05/2015 NEGATIVE  NEGATIVE Final   Comment:        THE SENSITIVITY OF THIS METHODOLOGY IS >10 mIU/mL.   Marland Kitchen Sodium 03/05/2015 136  135 - 145 mmol/L Final  . Potassium 03/05/2015 3.5  3.5 - 5.1 mmol/L Final  . Chloride 03/05/2015 102  101 - 111 mmol/L Final  . CO2 03/05/2015 25  22 - 32 mmol/L Final  . Glucose, Bld 03/05/2015 81  65 - 99 mg/dL Final  . BUN 03/05/2015 11  6 - 20 mg/dL Final  . Creatinine, Ser 03/05/2015 0.78  0.44 - 1.00 mg/dL Final  . Calcium 03/05/2015 9.6  8.9 - 10.3 mg/dL Final  . GFR calc non Af Amer 03/05/2015 >60  >60 mL/min Final  . GFR calc Af Amer 03/05/2015 >60  >60 mL/min Final   Comment: (NOTE) The eGFR has been calculated using the CKD EPI equation. This calculation has not been validated in all clinical situations. eGFR's persistently <60 mL/min signify possible Chronic Kidney Disease.   . Anion gap 03/05/2015 9  5 - 15 Final  . WBC 03/05/2015 5.5  4.0 - 10.5 K/uL Final  . RBC 03/05/2015 4.93  3.87 - 5.11 MIL/uL Final  . Hemoglobin 03/05/2015 10.5* 12.0 - 15.0 g/dL Final  . HCT 03/05/2015 34.2* 36.0 - 46.0 % Final  . MCV 03/05/2015 69.4* 78.0 - 100.0 fL Final  . MCH 03/05/2015 21.3* 26.0 - 34.0 pg Final  . MCHC  03/05/2015 30.7  30.0 - 36.0 g/dL Final  . RDW 03/05/2015 16.8* 11.5 - 15.5 % Final  . Platelets 03/05/2015 270  150 - 400 K/uL Final   Comment: RESULT REPEATED AND VERIFIED SPECIMEN CHECKED FOR CLOTS PLATELET COUNT CONFIRMED BY SMEAR   . MRSA, PCR 03/05/2015 NEGATIVE  NEGATIVE Final  . Staphylococcus aureus 03/05/2015 POSITIVE* NEGATIVE Final   Comment:        The Xpert SA Assay (FDA approved for NASAL specimens in patients over 81 years of age), is one component of a comprehensive surveillance program.  Test performance has been validated by [facility] for patients greater than or equal to 80 year old. It is not intended to diagnose infection nor to guide or monitor treatment.    No results for input(s): HGB in the last 72 hours. No results for input(s): WBC, RBC, HCT, PLT in the last 72 hours.  Recent Labs  03/11/15 0543  NA 138  K 4.2  CL 106  CO2 28  BUN 10  CREATININE 0.96  GLUCOSE 110*  CALCIUM 8.6*   No results for input(s): LABPT, INR in the last 72 hours.  X-Rays:Dg Lumbar Spine 2-3 Views  03/08/2015   ADDENDUM REPORT: 03/08/2015 08:02 ADDENDUM: Vertebral levels have been  labeled. Electronically Signed   By: Marijo Conception, M.D.   On: 03/08/2015 08:02  03/08/2015   CLINICAL DATA:  Lumbar herniated nucleus polposus.  EXAM: LUMBAR SPINE - 2-3 VIEW  COMPARISON:  MRI of January 12, 2015.  FINDINGS: There is no evidence of lumbar spine fracture. Alignment is normal. Intervertebral disc spaces are maintained.  IMPRESSION: Normal lumbar spine.  Electronically Signed: By: Marijo Conception, M.D. On: 03/05/2015 16:22   Dg Spine Portable 1 View  03/10/2015   CLINICAL DATA:  Lumbar decompression at L4-5  EXAM: PORTABLE SPINE - 1 VIEW  COMPARISON:  Film from earlier in the same day  FINDINGS: Five lumbar type vertebral bodies are well visualized. The numbering nomenclature is similar to that utilized on the prior exam. Surgical instruments are now seen within the L4-5  interspace as well as within the spinal canal at the same level.  IMPRESSION: Intraoperative localization at L4-5.   Electronically Signed   By: Inez Catalina M.D.   On: 03/10/2015 10:04   Dg Spine Portable 1 View  03/10/2015   CLINICAL DATA:  Intraoperative localization lumbar decompression L4-5  EXAM: PORTABLE SPINE - 1 VIEW  COMPARISON:  03/10/2015  FINDINGS: There is a localization probe directed toward the pedicles of L4. A second localization probe is directed toward the superior articular facet of L5.  IMPRESSION: Intraoperative localization   Electronically Signed   By: Skipper Cliche M.D.   On: 03/10/2015 09:32   Dg Spine Portable 1 View  03/10/2015   CLINICAL DATA:  Lumbar decompression L4-5.  EXAM: PORTABLE SPINE - 1 VIEW  COMPARISON:  03/05/2015  FINDINGS: Lateral intraoperative image demonstrates posterior needles directed in the L3-4 and L4-5 intervertebral spaces.  IMPRESSION: Intraoperative localization as above.   Electronically Signed   By: Rolm Baptise M.D.   On: 03/10/2015 09:18    EKG: Orders placed or performed during the hospital encounter of 03/05/15  . EKG 12-Lead  . EKG 12-Lead     Hospital Course: Patient was admitted to [facility] and taken to the OR and underwent the above state procedure without complications.  Patient tolerated the procedure well and was later transferred to the recovery room and then to the orthopaedic floor for postoperative care.  They were given PO and IV analgesics for pain control following their surgery.  They were given 24 hours of postoperative antibiotics.   PT was consulted postop to assist with mobility and transfers.  The patient was allowed to be WBAT with therapy and was taught back precautions. Discharge planning was consulted to help with postop disposition and equipment needs.  Patient had a fair night on the evening of surgery and started to get up OOB with therapy on day one. Patient was seen in rounds and was ready to go  home on day one.  They were given discharge instructions and dressing directions.  They were instructed on when to follow up in the office with Dr. Tonita Cong.   Diet: Regular diet Activity:WBAT; Lspine precautions Follow-up:in 10-14 days Disposition - Home Discharged Condition: good   Discharge Instructions    Call MD / Call 911    Complete by:  As directed   If you experience chest pain or shortness of breath, CALL 911 and be transported to the hospital emergency room.  If you develope a fever above 101 F, pus (white drainage) or increased drainage or redness at the wound, or calf pain, call your surgeon's office.  Constipation Prevention    Complete by:  As directed   Drink plenty of fluids.  Prune juice may be helpful.  You may use a stool softener, such as Colace (over the counter) 100 mg twice a day.  Use MiraLax (over the counter) for constipation as needed.     Diet - low sodium heart healthy    Complete by:  As directed      Increase activity slowly as tolerated    Complete by:  As directed             Medication List    STOP taking these medications        gabapentin 600 MG tablet  Commonly known as:  NEURONTIN  Replaced by:  gabapentin 300 MG capsule     HYDROcodone-acetaminophen 5-325 MG per tablet  Commonly known as:  NORCO/VICODIN      TAKE these medications        docusate sodium 100 MG capsule  Commonly known as:  COLACE  Take 1 capsule (100 mg total) by mouth 2 (two) times daily as needed for mild constipation.     gabapentin 300 MG capsule  Commonly known as:  NEURONTIN  Take 1 capsule (300 mg total) by mouth 3 (three) times daily.     ibuprofen 800 MG tablet  Commonly known as:  ADVIL,MOTRIN  Take 1 tablet (800 mg total) by mouth every 8 (eight) hours as needed for moderate pain. Resume 5 days post-op     lisinopril-hydrochlorothiazide 20-25 MG per tablet  Commonly known as:  PRINZIDE,ZESTORETIC  Take 1 tablet by mouth daily.     methocarbamol  500 MG tablet  Commonly known as:  ROBAXIN  Take 1 tablet (500 mg total) by mouth every 8 (eight) hours as needed for muscle spasms.     oxyCODONE-acetaminophen 5-325 MG per tablet  Commonly known as:  PERCOCET  Take 1 tablet by mouth every 4 (four) hours as needed.           Follow-up Information    Follow up with BEANE,JEFFREY C, MD In 2 weeks.   Specialty:  Orthopedic Surgery   Why:  For suture removal   Contact information:   [address] [place] [address] [phone]       Signed: Lacie Draft, PA-C Orthopaedic Surgery 03/12/2015, 7:58 AM

## 2015-03-12 NOTE — Progress Notes (Addendum)
Occupational Therapy Treatment Patient Details Name: RIOT BARRICK MRN: [id number] DOB: Mar 20, 1971 Today's Date: 03/12/2015    History of present illness Pt s/p L4-5 microlumbar decompression   OT comments  Pt demonstrated or verbalized all education.  No further OT is needed  Follow Up Recommendations  No OT follow up;Supervision/Assistance - 24 hour    Equipment Recommendations  3 in 1 bedside comode;Tub/shower bench (AE kit and toilet aide)    Recommendations for Other Services      Precautions / Restrictions Precautions Precautions: Back;Fall Precaution Comments: Pt recalls all back precautions` Restrictions Weight Bearing Restrictions: No       Mobility Bed Mobility Overal bed mobility: Needs Assistance Bed Mobility: Rolling;Sit to Supine;Sit to Sidelying Rolling: Supervision     Sit to supine: Supervision Sit to sidelying: Min guard General bed mobility comments: pt able to get legs back on bed:  stood by  for assistance if needed.  Cues not to scoot over in bed prior to rolling  Transfers Overall transfer level: Needs assistance Equipment used: Rolling walker (2 wheeled) Transfers: Sit to/from Stand Sit to Stand: Supervision         General transfer comment: cues for back precautions    Balance                                   ADL                       Lower Body Dressing: Supervision/safety;Adhering to back precautions;With adaptive equipment;Sit to/from stand (underwear and pants)   Toilet Transfer: Supervision/safety;Ambulation;BSC;RW   Toileting- Clothing Manipulation and Hygiene: Supervision/safety;Sit to/from stand (front peri only)         General ADL Comments: Pt practiced with reacher:  verbalizes sock aide from yesterday and did not feel she needed to practice again.  Educated on toilet aide for back peri area and demonstrated tub bench      Vision                     Perception     Praxis       Cognition   Behavior During Therapy: WFL for tasks assessed/performed Overall Cognitive Status: Within Functional Limits for tasks assessed                       Extremity/Trunk Assessment               Exercises     Shoulder Instructions       General Comments      Pertinent Vitals/ Pain       Pain Assessment: 0-10 Pain Score: 6  Pain Location: back Pain Descriptors / Indicators: Aching Pain Intervention(s): Limited activity within patient's tolerance;Monitored during session;Repositioned;Patient requesting pain meds-RN notified  Home Living                                          Prior Functioning/Environment              Frequency       Progress Toward Goals  OT Goals(current goals can now be found in the care plan section)  Progress towards OT goals: Goals met/education completed, patient discharged from OT (verbalizes shower; other goals are met)  Acute Rehab OT Goals  Patient Stated Goal: Less pain  Plan   Note: pt verbalizes all education:  Unable to chart in education section of chart.   Co-evaluation                 End of Session     Activity Tolerance Patient tolerated treatment well   Patient Left in bed;with call bell/phone within reach (pt requested 4 siderails up)   Nurse Communication      Functional Assessment Tool Used: clinical observation Functional Limitation: Self care Self Care Discharge Status [phone]): At least 1 percent but less than 20 percent impaired, limited or restricted   Time: 8466-5993 OT Time Calculation (min): 19 min  Charges: OT G-codes **NOT FOR INPATIENT CLASS** Functional Assessment Tool Used: clinical observation Functional Limitation: Self care Self Care Discharge Status [phone]): At least 1 percent but less than 20 percent impaired, limited or restricted OT General Charges $OT Visit: 1 Procedure OT Treatments $Self Care/Home Management : 8-22  mins  SPENCER,MARYELLEN 03/12/2015, 9:31 AM  Lesle Chris, OTR/L [phone] 03/12/2015

## 2015-03-12 NOTE — Progress Notes (Signed)
Contacted workers comp case worker Edgardo Roys regarding DME, he is aware and will have DME delivered here today. Emailing DME orders per request.

## 2015-09-17 ENCOUNTER — Other Ambulatory Visit: Payer: Self-pay | Admitting: Orthopedic Surgery

## 2015-09-17 DIAGNOSIS — M25512 Pain in left shoulder: Secondary | ICD-10-CM

## 2015-09-21 ENCOUNTER — Ambulatory Visit
Admission: RE | Admit: 2015-09-21 | Discharge: 2015-09-21 | Disposition: A | Discharge status: Home / Self Care | Payer: Worker's Compensation | Source: Ambulatory Visit | Attending: Orthopedic Surgery | Admitting: Orthopedic Surgery

## 2015-09-21 DIAGNOSIS — M25512 Pain in left shoulder: Secondary | ICD-10-CM

## 2016-03-31 ENCOUNTER — Emergency Department (HOSPITAL_COMMUNITY)
Admission: EM | Admit: 2016-03-31 | Discharge: 2016-03-31 | Disposition: A | Discharge status: Home / Self Care | Payer: Self-pay | Attending: Emergency Medicine | Admitting: Emergency Medicine

## 2016-03-31 ENCOUNTER — Emergency Department (HOSPITAL_COMMUNITY): Payer: Self-pay

## 2016-03-31 ENCOUNTER — Encounter (HOSPITAL_COMMUNITY): Payer: Self-pay

## 2016-03-31 DIAGNOSIS — S6992XA Unspecified injury of left wrist, hand and finger(s), initial encounter: Secondary | ICD-10-CM | POA: Insufficient documentation

## 2016-03-31 DIAGNOSIS — W228XXA Striking against or struck by other objects, initial encounter: Secondary | ICD-10-CM | POA: Insufficient documentation

## 2016-03-31 DIAGNOSIS — Y939 Activity, unspecified: Secondary | ICD-10-CM | POA: Insufficient documentation

## 2016-03-31 DIAGNOSIS — I10 Essential (primary) hypertension: Secondary | ICD-10-CM | POA: Insufficient documentation

## 2016-03-31 DIAGNOSIS — Y999 Unspecified external cause status: Secondary | ICD-10-CM | POA: Insufficient documentation

## 2016-03-31 DIAGNOSIS — M79645 Pain in left finger(s): Secondary | ICD-10-CM

## 2016-03-31 DIAGNOSIS — Y929 Unspecified place or not applicable: Secondary | ICD-10-CM | POA: Insufficient documentation

## 2016-03-31 DIAGNOSIS — R21 Rash and other nonspecific skin eruption: Secondary | ICD-10-CM | POA: Insufficient documentation

## 2016-03-31 MED ORDER — TRIAMCINOLONE ACETONIDE 0.5 % EX OINT: 1.0000 "application " | TOPICAL_OINTMENT | Freq: Two times a day (BID) | CUTANEOUS | 0 refills | Status: AC

## 2016-03-31 MED ORDER — LIDOCAINE HCL 2 % IJ SOLN
10.0000 mL | Freq: Once | INTRAMUSCULAR | Status: AC
  Administered 2016-03-31: 200 mg via INTRADERMAL
  Filled 2016-03-31: qty 20

## 2016-03-31 NOTE — ED Provider Notes (Signed)
[facility] DEPT Provider Note   CSN: [id number] Arrival date & time: 03/31/16  4098     History   Chief Complaint Chief Complaint  Patient presents with  . Finger Injury    HPI Jasmin Santana is a 45 y.o. female who presents with left middle finger pain. She states that she slammed her finger in a door about 2 weeks ago. She hasn't had difficulty with range of motion of finger however she has had increased pain and swelling to the distal part of her finger. She believes it may be infected. She denies fever. Additionally she is stating she has been out of her long term medicines for the past 6 months. She lost her insurance and has been not able to see a primary care provider. She is requesting a refill of her hypertension medicine. Denies fever, headache, chest pain, shortness of breath today.  Additionally she is complaining of a rash on bilateral hands and arms. It is not itchy or painful. No hx of eczema.   HPI  Past Medical History:  Diagnosis Date  . Hypertension   . Numbness and tingling    from left hip down/both feet bilat   . Wears glasses     Patient Active Problem List   Diagnosis Date Noted  . HNP (herniated nucleus pulposus), lumbar 03/10/2015  . Spinal stenosis of lumbar region 03/10/2015    Past Surgical History:  Procedure Laterality Date  . CESAREAN SECTION    . LUMBAR LAMINECTOMY/DECOMPRESSION MICRODISCECTOMY Left 03/10/2015   Procedure: MICRO LUMBAR DECOMPRESSION L4-5 ON LEFT;  Surgeon: Jene Every, MD;  Location: [facility];  Service: Orthopedics;  Laterality: Left;  . SHOULDER SURGERY     left / 5 to 6 years ago     OB History    Gravida Para Term Preterm AB Living   0 0 2   SAB TAB Ectopic Multiple Live Births   0 0 0 0 2       Home Medications    Prior to Admission medications   Medication Sig Start Date End Date Taking? Authorizing Provider  docusate sodium (COLACE) 100 MG capsule Take 1 capsule (100 mg total) by mouth 2 (two)  times daily as needed for mild constipation. 03/10/15   Jene Every, MD  gabapentin (NEURONTIN) 300 MG capsule Take 1 capsule (300 mg total) by mouth 3 (three) times daily. 03/10/15   Jene Every, MD  ibuprofen (ADVIL,MOTRIN) 800 MG tablet Take 1 tablet (800 mg total) by mouth every 8 (eight) hours as needed for moderate pain. Resume 5 days post-op 03/11/15   Dorothy Spark, PA-C  lisinopril-hydrochlorothiazide (PRINZIDE,ZESTORETIC) 20-25 MG per tablet Take 1 tablet by mouth daily.    Historical Provider, MD  methocarbamol (ROBAXIN) 500 MG tablet Take 1 tablet (500 mg total) by mouth every 8 (eight) hours as needed for muscle spasms. 03/10/15   Jene Every, MD  oxyCODONE-acetaminophen (PERCOCET) 5-325 MG per tablet Take 1 tablet by mouth every 4 (four) hours as needed. 03/10/15   Jene Every, MD    Family History No family history on file.  Social History Social History  Substance Use Topics  . Smoking status: Never Smoker  . Smokeless tobacco: Never Used  . Alcohol use No     Allergies   Review of patient's allergies indicates no known allergies.   Review of Systems Review of Systems  Constitutional: Negative for fever.  Respiratory: Negative for shortness of breath.   Cardiovascular: Negative for  chest pain.  Musculoskeletal: Positive for arthralgias and joint swelling.  Skin: Positive for wound.  All other systems reviewed and are negative.    Physical Exam Updated Vital Signs BP (!) 157/106 (BP Location: Left Arm)   Pulse 78   Temp 98.5 F (36.9 C) (Oral)   Resp 18   Ht 5\' 5"  (1.651 m)   Wt 81.6 kg   LMP 03/31/2016   SpO2 100%   BMI 29.95 kg/m   Physical Exam  Constitutional: She is oriented to person, place, and time. She appears well-developed and well-nourished. No distress.  HENT:  Head: Normocephalic and atraumatic.  Eyes: Conjunctivae are normal. Pupils are equal, round, and reactive to light. Right eye exhibits no discharge. Left eye exhibits no  discharge. No scleral icterus.  Neck: Normal range of motion. Neck supple.  Cardiovascular: Normal rate and regular rhythm.   No murmur heard. Pulmonary/Chest: Effort normal and breath sounds normal. No respiratory distress.  Abdominal: Soft. She exhibits no distension. There is no tenderness.  Musculoskeletal: She exhibits no edema.  Left hand: Mild circumferential swelling over distal middle finger with warmth but without obvious paronychia. No deformity. Tenderness to palpation over distal middle finger. FROM of middle finger. N/V intact.   Neurological: She is alert and oriented to person, place, and time.  Skin: Skin is warm and dry.  Small papules in round distribution scattered on hands and arms without erythema, tenderness, or drainage  Psychiatric: She has a normal mood and affect. Her behavior is normal.  Nursing note and vitals reviewed.    ED Treatments / Results  Labs (all labs ordered are listed, but only abnormal results are displayed) Labs Reviewed - No data to display  EKG  EKG Interpretation None       Radiology Dg Finger Middle Left  Result Date: 03/31/2016 CLINICAL DATA:  45 year old female status post blunt trauma to the left middle finger 2 weeks ago with continued pain at the distal phalanx and nail bed. Initial encounter. EXAM: LEFT MIDDLE FINGER 2+V COMPARISON:  None. FINDINGS: Bone mineralization is within normal limits. The left third distal phalanx and visible third ray and adjacent osseous structures appear intact. Joint spaces are normal. There is distal left third finger soft tissue swelling. No subcutaneous gas. No radiopaque foreign body identified. IMPRESSION: Soft tissue swelling but no other radiographic abnormality in the left third finger. Electronically Signed   By: Odessa Fleming M.D.   On: 03/31/2016 09:33    Procedures Procedures (including critical care time)  INCISION AND DRAINAGE Performed by: Bethel Born Consent: Verbal consent  obtained. Risks and benefits: risks, benefits and alternatives were discussed Type: abscess  Body area: Right   Anesthesia: local infiltration  Incision was made with a scalpel.  Local anesthetic: lidocaine 2%  Anesthetic total: 6 ml  Complexity: simple Blunt dissection to break up loculations  Drainage: none  Drainage amount: none  Packing material: none  Patient tolerance: Patient tolerated the procedure well with no immediate complications.     Medications Ordered in ED Medications  lidocaine (XYLOCAINE) 2 % (with pres) injection 200 mg (200 mg Intradermal Given 03/31/16 0940)     Initial Impression / Assessment and Plan / ED Course  I have reviewed the triage vital signs and the nursing notes.  Pertinent labs & imaging results that were available during my care of the patient were reviewed by me and considered in my medical decision making (see chart for details).  Clinical Course  45 year old female presents with pain and swelling to distal middle finger. Digital block and I&D performed however there was no purulent drainage. Xray negative. Will treat with Ibuprofen. She is also complaining of a rash. Unclear etiology. Will rx steroid cream. Also asked CM to help patient set up appointment with PCP. She is scheduled for appt on Monday at 3:30 as a new pt. Patient is NAD, non-toxic, with stable VS. Patient is informed of clinical course, understands medical decision making process, and agrees with plan. Opportunity for questions provided and all questions answered. Return precautions given.   Final Clinical Impressions(s) / ED Diagnoses   Final diagnoses:  Pain of left middle finger    New Prescriptions Discharge Medication List as of 03/31/2016 10:12 AM       Bethel Born, PA-C 04/03/16 2117    Lyndal Pulley, MD 04/05/16 1756

## 2016-03-31 NOTE — ED Notes (Signed)
Pt requesting to speak with PA again, regarding a skin cream.

## 2016-03-31 NOTE — ED Triage Notes (Addendum)
Per Pt, Pt slammed left middle finger in the door last Saturday. Pt reports pain and discomfort. Also, pt requests refill on HTN medication. Pt complains of Hx of headache with none present.

## 2016-03-31 NOTE — Discharge Instructions (Signed)
Take Ibuprofen 600mg  three times a day for pain You can ice your finger to reduce swelling Follow up on Monday at 3:15 to see your PCP

## 2016-03-31 NOTE — Discharge Planning (Signed)
Jasmin Bautch [name], RN, BSN, [place] [phone] ED CM consulted regarding PCP establishment. Pt presented to [facility] ED today with sore finger and asking for medication refill. NCM met with pt at bedside; pt confirms not having access to f/u care with PCP. Discussed with patient importance and benefits of establishing PCP, and not utilizing the ED for primary care needs. Pt verbalized understanding and is in agreement. Discussed other options, provided list of local  affordable PCPs.  Pt voiced interest in the [facility].   Informed pt of appointment date (10/16) and time (3:30).  No further case management needs communicated at this time.

## 2016-03-31 NOTE — ED Notes (Signed)
Pt reports L middle finger, slammed it on the door over a week ago. Finger appears swollen.  Pt is also requesting a prescription for her BP.  States she ran out x 6 months ago.  Does not have a PCP.  Pt denies any h/a or dizziness today.  Pt is A&O x 4

## 2016-08-25 ENCOUNTER — Emergency Department (HOSPITAL_COMMUNITY): Payer: No Typology Code available for payment source

## 2016-08-25 ENCOUNTER — Encounter (HOSPITAL_COMMUNITY): Payer: Self-pay | Admitting: *Deleted

## 2016-08-25 ENCOUNTER — Emergency Department (HOSPITAL_COMMUNITY)
Admission: EM | Admit: 2016-08-25 | Discharge: 2016-08-26 | Disposition: A | Discharge status: Home / Self Care | Payer: No Typology Code available for payment source | Attending: Emergency Medicine | Admitting: Emergency Medicine

## 2016-08-25 DIAGNOSIS — Y939 Activity, unspecified: Secondary | ICD-10-CM | POA: Diagnosis not present

## 2016-08-25 DIAGNOSIS — I10 Essential (primary) hypertension: Secondary | ICD-10-CM | POA: Diagnosis not present

## 2016-08-25 DIAGNOSIS — Y999 Unspecified external cause status: Secondary | ICD-10-CM | POA: Diagnosis not present

## 2016-08-25 DIAGNOSIS — Y92410 Unspecified street and highway as the place of occurrence of the external cause: Secondary | ICD-10-CM | POA: Diagnosis not present

## 2016-08-25 DIAGNOSIS — Z79899 Other long term (current) drug therapy: Secondary | ICD-10-CM | POA: Insufficient documentation

## 2016-08-25 DIAGNOSIS — S39012A Strain of muscle, fascia and tendon of lower back, initial encounter: Secondary | ICD-10-CM | POA: Diagnosis not present

## 2016-08-25 DIAGNOSIS — S3992XA Unspecified injury of lower back, initial encounter: Secondary | ICD-10-CM | POA: Diagnosis present

## 2016-08-25 MED ORDER — CYCLOBENZAPRINE HCL 10 MG PO TABS
10.0000 mg | ORAL_TABLET | Freq: Once | ORAL | Status: AC
Start: 2016-08-25 — End: 2016-08-25
  Administered 2016-08-25: 10 mg via ORAL
  Filled 2016-08-25: qty 1

## 2016-08-25 MED ORDER — CYCLOBENZAPRINE HCL 5 MG PO TABS: 5.0000 mg | ORAL_TABLET | Freq: Three times a day (TID) | ORAL | 0 refills | Status: DC | PRN

## 2016-08-25 MED ORDER — IBUPROFEN 600 MG PO TABS
600.0000 mg | ORAL_TABLET | Freq: Four times a day (QID) | ORAL | 0 refills | Status: AC | PRN
Start: 2016-08-25 — End: ?

## 2016-08-25 NOTE — Discharge Instructions (Signed)
Do not drive while taking the muscle relaxant as it can make you sleepy °

## 2016-08-25 NOTE — ED Notes (Signed)
Patient back from x-ray 
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
## 2016-08-26 NOTE — ED Notes (Signed)
Patient able to ambulate independently  

## 2016-09-12 ENCOUNTER — Other Ambulatory Visit: Payer: Self-pay | Admitting: Physical Medicine and Rehabilitation

## 2016-09-12 DIAGNOSIS — S46022A Laceration of muscle(s) and tendon(s) of the rotator cuff of left shoulder, initial encounter: Secondary | ICD-10-CM

## 2016-09-12 DIAGNOSIS — M25512 Pain in left shoulder: Secondary | ICD-10-CM

## 2016-09-19 ENCOUNTER — Ambulatory Visit
Admission: RE | Admit: 2016-09-19 | Discharge: 2016-09-19 | Disposition: A | Discharge status: Home / Self Care | Payer: Self-pay | Source: Ambulatory Visit | Attending: Physical Medicine and Rehabilitation | Admitting: Physical Medicine and Rehabilitation

## 2016-09-19 DIAGNOSIS — M25512 Pain in left shoulder: Secondary | ICD-10-CM

## 2016-09-19 DIAGNOSIS — S46022A Laceration of muscle(s) and tendon(s) of the rotator cuff of left shoulder, initial encounter: Secondary | ICD-10-CM

## 2017-01-16 ENCOUNTER — Encounter (HOSPITAL_COMMUNITY): Payer: Self-pay | Admitting: Emergency Medicine

## 2017-01-16 ENCOUNTER — Emergency Department (HOSPITAL_COMMUNITY)
Admission: EM | Admit: 2017-01-16 | Discharge: 2017-01-16 | Disposition: A | Discharge status: Home / Self Care | Payer: No Typology Code available for payment source | Attending: Emergency Medicine | Admitting: Emergency Medicine

## 2017-01-16 DIAGNOSIS — I10 Essential (primary) hypertension: Secondary | ICD-10-CM | POA: Diagnosis not present

## 2017-01-16 DIAGNOSIS — Y939 Activity, unspecified: Secondary | ICD-10-CM | POA: Diagnosis not present

## 2017-01-16 DIAGNOSIS — M791 Myalgia: Secondary | ICD-10-CM | POA: Diagnosis present

## 2017-01-16 DIAGNOSIS — Y998 Other external cause status: Secondary | ICD-10-CM | POA: Diagnosis not present

## 2017-01-16 DIAGNOSIS — Z79899 Other long term (current) drug therapy: Secondary | ICD-10-CM | POA: Diagnosis not present

## 2017-01-16 DIAGNOSIS — Y92410 Unspecified street and highway as the place of occurrence of the external cause: Secondary | ICD-10-CM | POA: Diagnosis not present

## 2017-01-16 DIAGNOSIS — M7918 Myalgia, other site: Secondary | ICD-10-CM

## 2017-01-16 MED ORDER — OXYCODONE-ACETAMINOPHEN 5-325 MG PO TABS: 1.0000 | ORAL_TABLET | ORAL | Status: DC | PRN

## 2017-01-16 MED ORDER — OXYCODONE-ACETAMINOPHEN 5-325 MG PO TABS
ORAL_TABLET | ORAL | Status: AC
  Administered 2017-01-16: 1
  Filled 2017-01-16: qty 1

## 2017-01-16 MED ORDER — CYCLOBENZAPRINE HCL 5 MG PO TABS: 5.0000 mg | ORAL_TABLET | Freq: Three times a day (TID) | ORAL | 0 refills | Status: AC | PRN

## 2017-01-16 MED ORDER — NAPROXEN 500 MG PO TABS: ORAL_TABLET | ORAL | 0 refills | Status: AC

## 2017-01-16 MED ORDER — NAPROXEN 250 MG PO TABS
500.0000 mg | ORAL_TABLET | Freq: Once | ORAL | Status: AC
  Administered 2017-01-16: 500 mg via ORAL
  Filled 2017-01-16: qty 2

## 2017-01-16 MED ORDER — CYCLOBENZAPRINE HCL 10 MG PO TABS
5.0000 mg | ORAL_TABLET | Freq: Once | ORAL | Status: AC
  Administered 2017-01-16: 5 mg via ORAL
  Filled 2017-01-16: qty 1

## 2017-01-16 NOTE — ED Notes (Signed)
Pt departed in NAD, refused use of wheelchair.  

## 2017-01-16 NOTE — Discharge Instructions (Signed)
Ice packs to the injured or sore muscles for the next several days then start using heat. Take the medications for pain and muscle spasms. Return to the ED for any problems listed on the head injury sheet. Recheck if you aren't improving in the next week. ° °
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
## 2021-07-02 ENCOUNTER — Emergency Department (HOSPITAL_COMMUNITY): Payer: Self-pay

## 2021-07-02 ENCOUNTER — Emergency Department (HOSPITAL_COMMUNITY)
Admission: EM | Admit: 2021-07-02 | Discharge: 2021-07-02 | Disposition: A | Discharge status: Home / Self Care | Payer: Self-pay | Attending: Student | Admitting: Student

## 2021-07-02 ENCOUNTER — Other Ambulatory Visit: Payer: Self-pay

## 2021-07-02 ENCOUNTER — Encounter (HOSPITAL_COMMUNITY): Payer: Self-pay | Admitting: Emergency Medicine

## 2021-07-02 DIAGNOSIS — Z79899 Other long term (current) drug therapy: Secondary | ICD-10-CM | POA: Insufficient documentation

## 2021-07-02 DIAGNOSIS — M542 Cervicalgia: Secondary | ICD-10-CM | POA: Insufficient documentation

## 2021-07-02 DIAGNOSIS — I10 Essential (primary) hypertension: Secondary | ICD-10-CM | POA: Insufficient documentation

## 2021-07-02 LAB — CBC WITH DIFFERENTIAL/PLATELET
Abs Immature Granulocytes: 0.01 10*3/uL (ref 0.00–0.07)
Basophils Absolute: 0 10*3/uL (ref 0.0–0.1)
Basophils Relative: 1 %
Eosinophils Absolute: 0 10*3/uL (ref 0.0–0.5)
Eosinophils Relative: 1 %
HCT: 31.8 % — ABNORMAL LOW (ref 36.0–46.0)
Hemoglobin: 8.7 g/dL — ABNORMAL LOW (ref 12.0–15.0)
Immature Granulocytes: 0 %
Lymphocytes Relative: 37 %
Lymphs Abs: 1.4 10*3/uL (ref 0.7–4.0)
MCH: 18.5 pg — ABNORMAL LOW (ref 26.0–34.0)
MCHC: 27.4 g/dL — ABNORMAL LOW (ref 30.0–36.0)
MCV: 67.5 fL — ABNORMAL LOW (ref 80.0–100.0)
Monocytes Absolute: 0.3 10*3/uL (ref 0.1–1.0)
Monocytes Relative: 8 %
Neutro Abs: 2.1 10*3/uL (ref 1.7–7.7)
Neutrophils Relative %: 53 %
Platelets: 196 10*3/uL (ref 150–400)
RBC: 4.71 MIL/uL (ref 3.87–5.11)
RDW: 19.2 % — ABNORMAL HIGH (ref 11.5–15.5)
WBC: 3.9 10*3/uL — ABNORMAL LOW (ref 4.0–10.5)
nRBC: 0 % (ref 0.0–0.2)

## 2021-07-02 LAB — I-STAT CHEM 8, ED
BUN: 12 mg/dL (ref 6–20)
Calcium, Ion: 1.22 mmol/L (ref 1.15–1.40)
Chloride: 106 mmol/L (ref 98–111)
Creatinine, Ser: 1 mg/dL (ref 0.44–1.00)
Glucose, Bld: 105 mg/dL — ABNORMAL HIGH (ref 70–99)
HCT: 31 % — ABNORMAL LOW (ref 36.0–46.0)
Hemoglobin: 10.5 g/dL — ABNORMAL LOW (ref 12.0–15.0)
Potassium: 3.8 mmol/L (ref 3.5–5.1)
Sodium: 141 mmol/L (ref 135–145)
TCO2: 27 mmol/L (ref 22–32)

## 2021-07-02 MED ORDER — AMLODIPINE BESYLATE 5 MG PO TABS
10.0000 mg | ORAL_TABLET | Freq: Once | ORAL | Status: AC
  Administered 2021-07-02: 10 mg via ORAL
  Filled 2021-07-02: qty 2

## 2021-07-02 MED ORDER — IOHEXOL 300 MG/ML  SOLN
80.0000 mL | Freq: Once | INTRAMUSCULAR | Status: AC | PRN
  Administered 2021-07-02: 80 mL via INTRAVENOUS

## 2021-07-02 MED ORDER — AMLODIPINE BESYLATE 10 MG PO TABS: 10.0000 mg | ORAL_TABLET | Freq: Every day | ORAL | 0 refills | Status: AC

## 2021-07-02 NOTE — ED Triage Notes (Signed)
C/o L sided neck pain x 3 weeks and swelling to L side of neck x 1 week.  Denies fever and chills.  Denies injury.

## 2021-07-02 NOTE — ED Provider Notes (Addendum)
patient signed out he is pending CT imaging.  Patient signed out to me is pending CT imaging of the neck.  CT imaging shows no acute pathology.  Will recommend outpatient follow-up with her doctor within the week.  Recommend immediate return for fevers cough vomiting or worsening symptoms.  Otherwise follow-up with primary care doctor within the week.    Cheryll Cockayne, MD 07/02/21 1806    Cheryll Cockayne, MD 07/02/21 1806

## 2021-07-02 NOTE — Discharge Instructions (Addendum)
Your blood pressure was significantly elevated here in the ER.  Please continue to follow-up with her primary care doctor regarding her hypertension. Call your primary care doctor or specialist as discussed in the next 2-3 days.   Return immediately back to the ER if:  Your symptoms worsen within the next 12-24 hours. You develop new symptoms such as new fevers, persistent vomiting, new pain, shortness of breath, or new weakness or numbness, or if you have any other concerns.

## 2021-07-02 NOTE — ED Provider Notes (Signed)
[facility] EMERGENCY DEPARTMENT Provider Note  CSN: [id number] Arrival date & time: 07/02/21 1130  Chief Complaint(s) Neck Pain and neck swelling  HPI NOHELIA VALENZA is a 51 y.o. female who presents the emergency department for evaluation of a left neck and shoulder swelling.  Patient states that for the last 3 weeks she has a intermittent swelling of the left trapezius muscle up into the neck that is painful.  She states that it has currently improved since she has been in the emergency department but it is still bothering her.  She denies any numbness, tingling, weakness, nausea, vomiting, fever, chest pain or any other systemic or neurologic complaints.  Denies dysphagia or difficulty breathing.   Neck Pain  Past Medical History Past Medical History:  Diagnosis Date   Hypertension    Numbness and tingling    from left hip down/both feet bilat    Wears glasses    Patient Active Problem List   Diagnosis Date Noted   HNP (herniated nucleus pulposus), lumbar 03/10/2015   Spinal stenosis of lumbar region 03/10/2015   Home Medication(s) Prior to Admission medications   Medication Sig Start Date End Date Taking? Authorizing Provider  cyclobenzaprine (FLEXERIL) 5 MG tablet Take 1 tablet (5 mg total) by mouth 3 (three) times daily as needed for muscle spasms. 01/16/17   Devoria Albe, MD  docusate sodium (COLACE) 100 MG capsule Take 1 capsule (100 mg total) by mouth 2 (two) times daily as needed for mild constipation. Patient not taking: Reported on 01/16/2017 03/10/15   Jene Every, MD  gabapentin (NEURONTIN) 300 MG capsule Take 1 capsule (300 mg total) by mouth 3 (three) times daily. Patient not taking: Reported on 01/16/2017 03/10/15   Jene Every, MD  ibuprofen (ADVIL,MOTRIN) 600 MG tablet Take 1 tablet (600 mg total) by mouth every 6 (six) hours as needed. Patient not taking: Reported on 01/16/2017 08/25/16   Janne Napoleon, NP  methocarbamol (ROBAXIN) 500 MG tablet  Take 1 tablet (500 mg total) by mouth every 8 (eight) hours as needed for muscle spasms. Patient not taking: Reported on 01/16/2017 03/10/15   Jene Every, MD  naproxen (NAPROSYN) 500 MG tablet Take 1 po BID with food prn pain 01/16/17   Devoria Albe, MD  oxyCODONE-acetaminophen (PERCOCET) 5-325 MG per tablet Take 1 tablet by mouth every 4 (four) hours as needed. Patient not taking: Reported on 01/16/2017 03/10/15   Jene Every, MD  triamcinolone ointment (KENALOG) 0.5 % Apply 1 application topically 2 (two) times daily. Patient not taking: Reported on 01/16/2017 03/31/16   Bethel Born, PA-C                                                                                                                                    Past Surgical History Past Surgical History:  Procedure Laterality Date   CESAREAN SECTION     LUMBAR LAMINECTOMY/DECOMPRESSION MICRODISCECTOMY Left  03/10/2015   Procedure: MICRO LUMBAR DECOMPRESSION L4-5 ON LEFT;  Surgeon: Jene EveryJeffrey Beane, MD;  Location: [facility];  Service: Orthopedics;  Laterality: Left;   SHOULDER SURGERY     left / 5 to 6 years ago    Family History No family history on file.  Social History Social History   Tobacco Use   Smoking status: Never   Smokeless tobacco: Never  Substance Use Topics   Alcohol use: No   Drug use: No   Allergies Patient has no known allergies.  Review of Systems Review of Systems  HENT:  Positive for facial swelling.   Musculoskeletal:  Positive for neck pain.   Physical Exam Vital Signs  I have reviewed the triage vital signs BP (!) 202/113    Pulse 73    Temp 98.8 F (37.1 C) (Oral)    Resp 14    LMP 06/27/2021    SpO2 100%   Physical Exam Vitals and nursing note reviewed.  Constitutional:      General: She is not in acute distress.    Appearance: She is well-developed.  HENT:     Head: Normocephalic and atraumatic.     Comments: Mild asymmetry of the distal neck and trapezius left greater than right,  tender to palpation Eyes:     Conjunctiva/sclera: Conjunctivae normal.  Cardiovascular:     Rate and Rhythm: Normal rate and regular rhythm.     Heart sounds: No murmur heard. Pulmonary:     Effort: Pulmonary effort is normal. No respiratory distress.     Breath sounds: Normal breath sounds.  Abdominal:     Palpations: Abdomen is soft.     Tenderness: There is no abdominal tenderness.  Musculoskeletal:        General: No swelling.     Cervical back: Neck supple.  Skin:    General: Skin is warm and dry.     Capillary Refill: Capillary refill takes less than 2 seconds.  Neurological:     Mental Status: She is alert.  Psychiatric:        Mood and Affect: Mood normal.    ED Results and Treatments Labs (all labs ordered are listed, but only abnormal results are displayed) Labs Reviewed  CBC WITH DIFFERENTIAL/PLATELET  I-STAT CHEM 8, ED                                                                                                                          Radiology No results found.  Pertinent labs & imaging results that were available during my care of the patient were reviewed by me and considered in my medical decision making (see MDM for details).  Medications Ordered in ED Medications - No data to display  Procedures Procedures  (including critical care time)  Medical Decision Making / ED Course   This patient presents to the ED for concern of neck swelling, this involves an extensive number of treatment options, and is a complaint that carries with it a high risk of complications and morbidity.  The differential diagnosis includes lymphadenitis, muscle strain, RPA, abscess, DVT  MDM: Patient seen emergency department for evaluation of neck swelling.  Physical exam reveals a very mild asymmetry along the trapezius on the left up into the  neck compared to the right.  It is mildly tender to palpation.  Cardiopulmonary exam unremarkable.  Laboratory evaluation is currently pending at time of signout.  CT neck is currently pending as well.  Patient then signed out to oncoming provider.  Please see provider signout for continuation of work-up.  Anticipate discharge.   Additional history obtained:  -External records from outside source obtained and reviewed including: Chart review including previous notes, labs, imaging, consultation notes   Lab Tests: -I ordered, reviewed, and interpreted labs.   The pertinent results include:   Labs Reviewed  CBC WITH DIFFERENTIAL/PLATELET  I-STAT CHEM 8, ED      Imaging Studies ordered: I ordered imaging studies including CT neck I independently visualized and interpreted imaging. I agree with the radiologist interpretation   Medicines ordered and prescription drug management: No orders of the defined types were placed in this encounter.   -I have reviewed the patients home medicines and have made adjustments as needed  Critical interventions None   Reevaluation: After the interventions noted above, I reevaluated the patient and found that they have :stayed the same  Co morbidities that complicate the patient evaluation  Past Medical History:  Diagnosis Date   Hypertension    Numbness and tingling    from left hip down/both feet bilat    Wears glasses       Dispostion: Pending CT imaging     Final Clinical Impression(s) / ED Diagnoses Final diagnoses:  None     @PCDICTATION @    , MD 07/02/21 1625

## 2023-10-21 IMAGING — CT CT NECK W/ CM
4 series · 15 of 33 positions shown, 18 images · IV contrast (Omni 300)
Comparison: None.

CLINICAL DATA: Soft tissue swelling, infection suspected; L sided
neck swelling

EXAM:
CT NECK WITH CONTRAST
TECHNIQUE: Multidetector CT imaging of the neck was performed using the
standard protocol following the bolus administration of intravenous
contrast.

[Series 3: neck 2.0 st · axial · 0.44mm/px · z∈[-278,-114]mm · 5 of 124 slices shown, 7 images]
[im 21/124  soft-tissue]
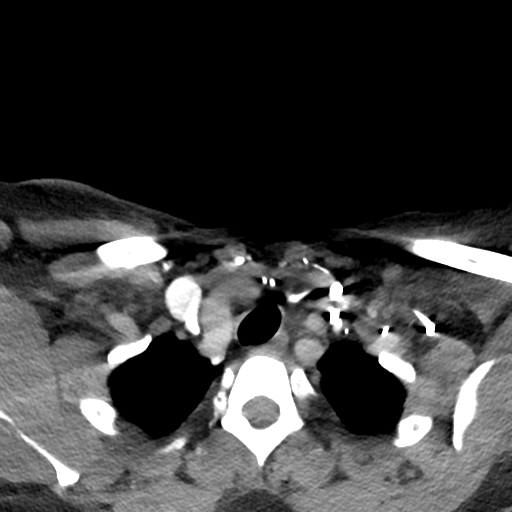
[im 21/124  bone]
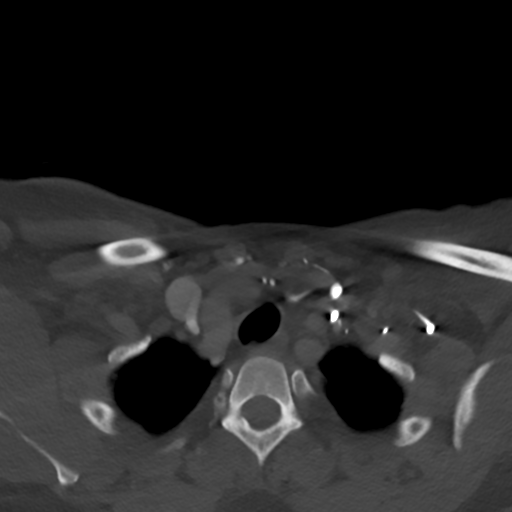
[im 42/124  bone]
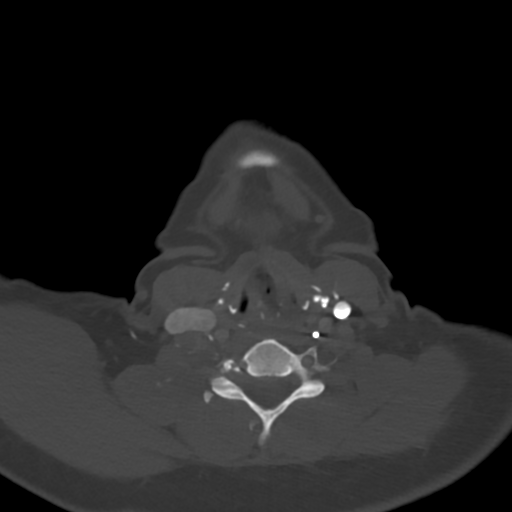
[im 62/124  bone]
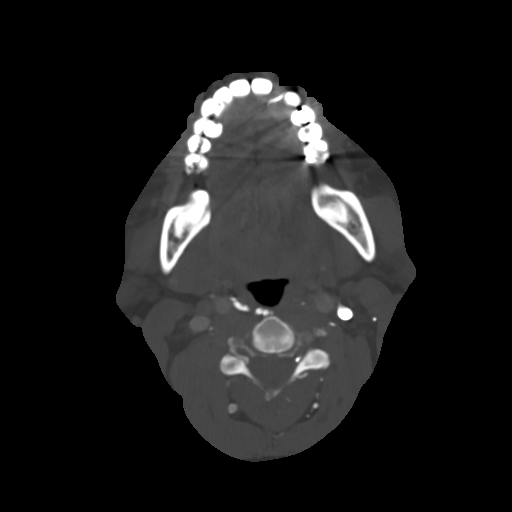
[im 83/124  bone]
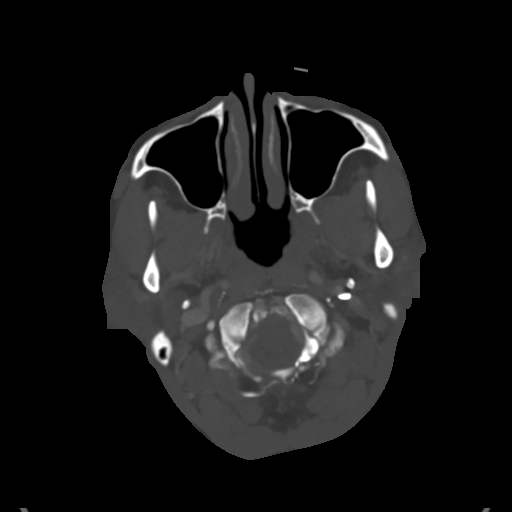
[im 103/124  soft-tissue]
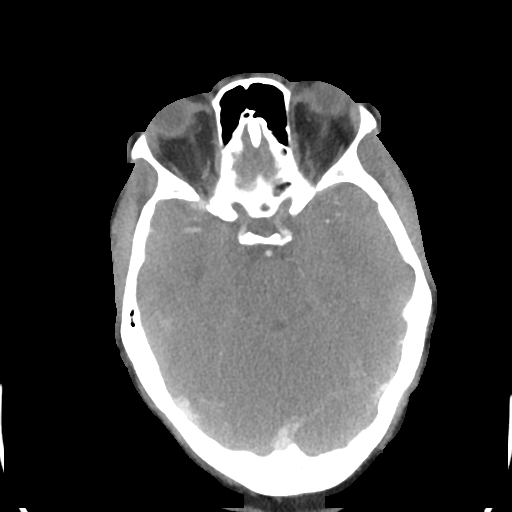
[im 103/124  bone]
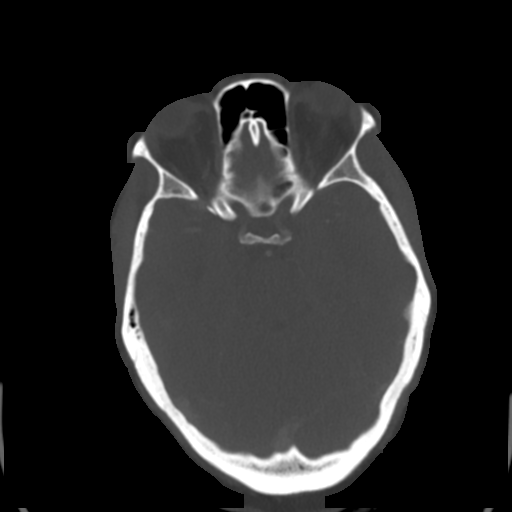

[Series 5: sagittal · sagittal · 0.49mm/px · 5 of 101 slices shown, 6 images]
[im 34/101  bone]
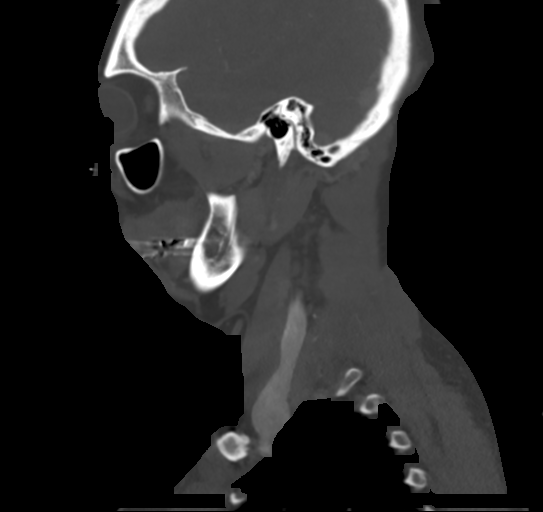
[im 42/101  bone]
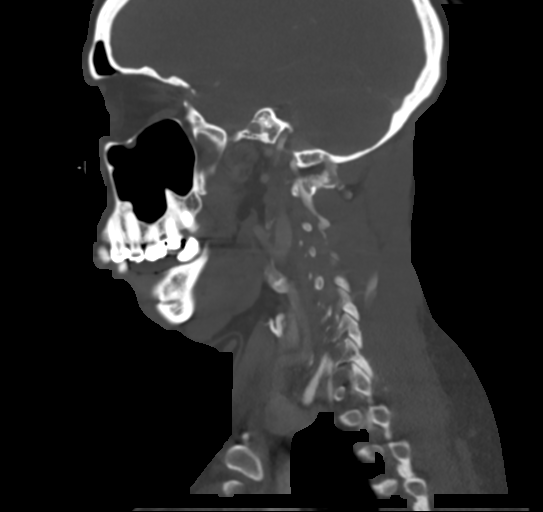
[im 51/101  soft-tissue]
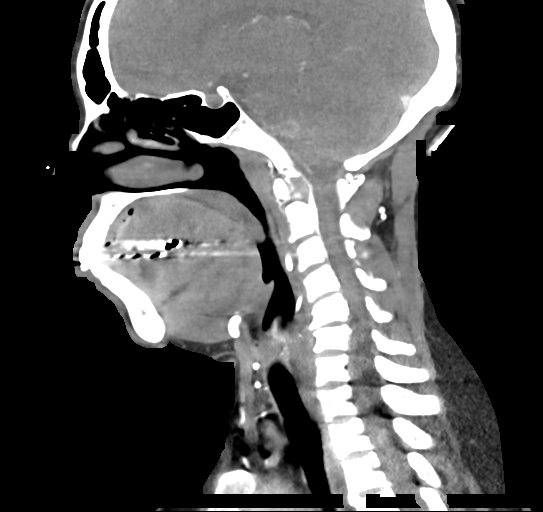
[im 51/101  bone]
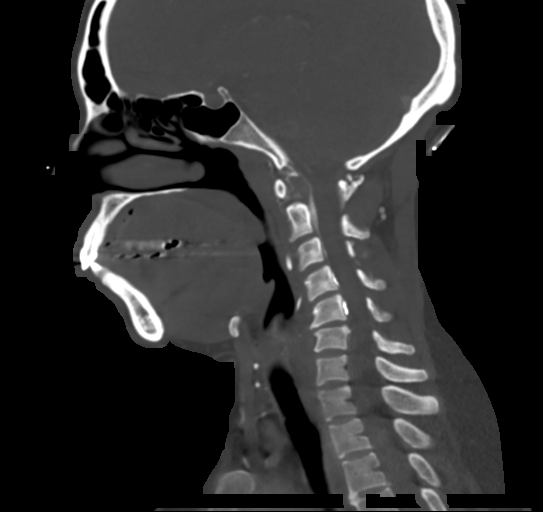
[im 59/101  bone]
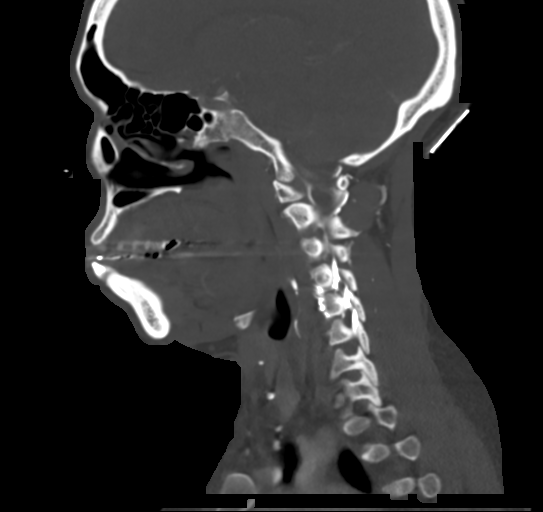
[im 67/101  bone]
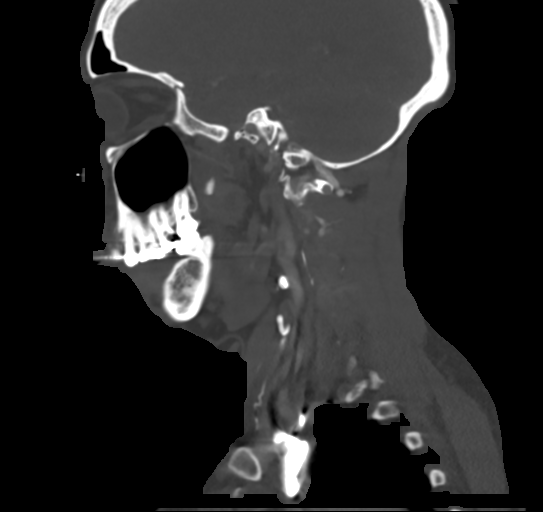

[Series 6: coronal · coronal · 0.38mm/px · 3 of 113 slices shown]
[im 23/113  bone]
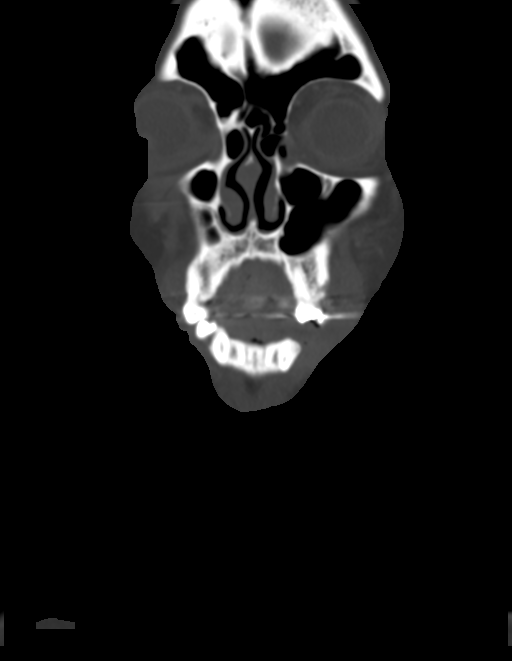
[im 45/113  bone]
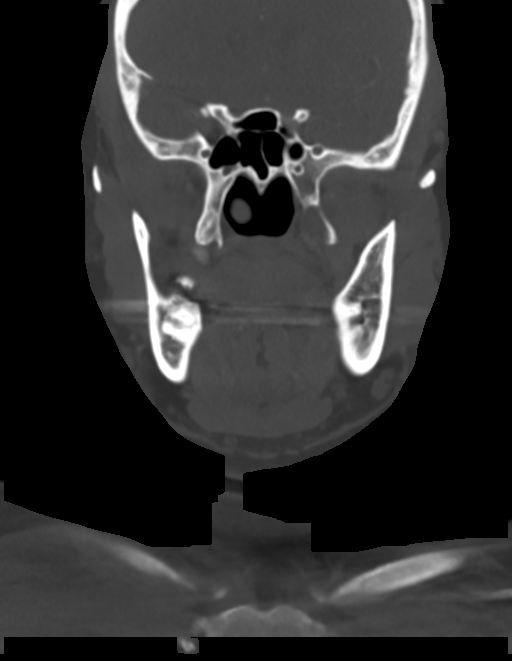
[im 68/113  bone]
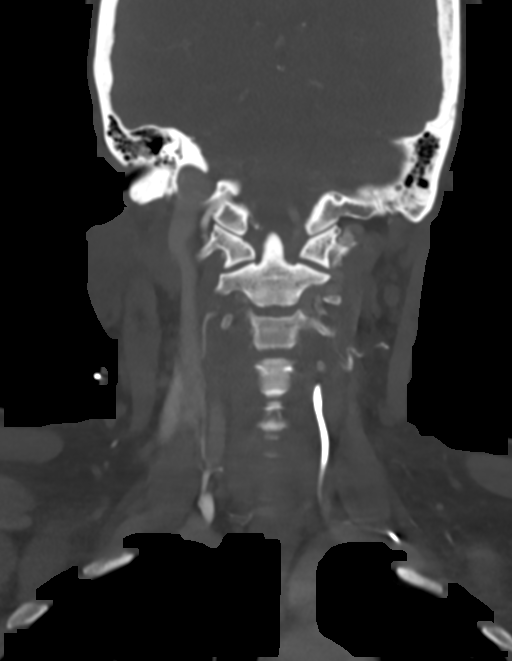

[Series 7: orthogonal · axial · 0.39mm/px · z∈[-276,-236]mm · 2 of 123 slices shown]
[im 21/123  bone]
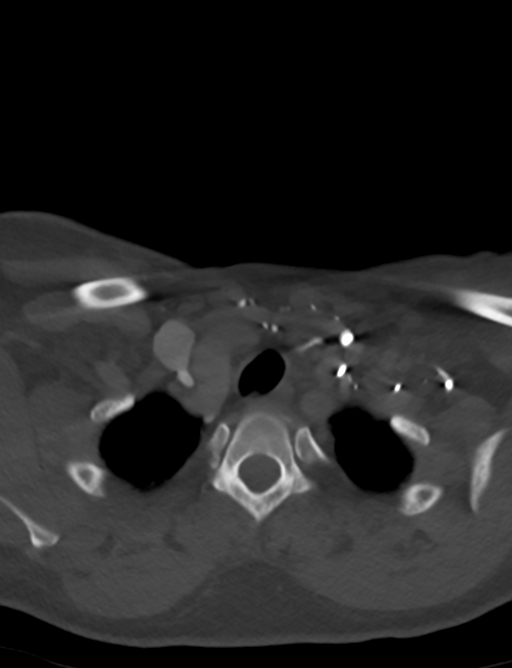
[im 41/123  bone]
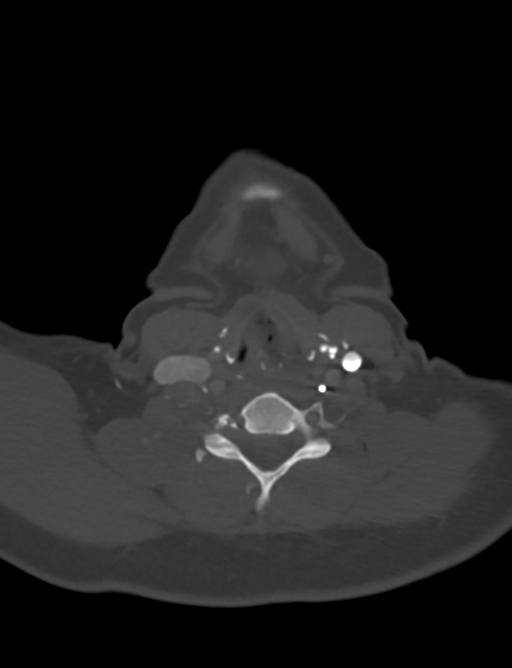

[15 of 33 positions shown; findings below may reference images not displayed]

RADIATION DOSE REDUCTION: This exam was performed according to the
departmental dose-optimization program which includes automated
exposure control, adjustment of the mA and/or kV according to
patient size and/or use of iterative reconstruction technique.

CONTRAST:  80mL OMNIPAQUE IOHEXOL 300 MG/ML  SOLN
FINDINGS: Pharynx and larynx: Unremarkable.  No mass or swelling.

Salivary glands: Parotid and submandibular glands are unremarkable.

Thyroid: Normal.

Lymph nodes: No enlarged or abnormal density nodes.

Vascular: Major neck vessels are patent.

Limited intracranial: No abnormal enhancement.

Visualized orbits: Unremarkable.

Mastoids and visualized paranasal sinuses: No significant
opacification.

Skeleton: No significant abnormality.

Upper chest: Included upper lungs are clear.

Other: None.
IMPRESSION: No neck mass or adenopathy.  No significant inflammatory changes.

## 2023-11-23 ENCOUNTER — Telehealth: Payer: Self-pay | Admitting: Obstetrics & Gynecology

## 2023-11-23 NOTE — Telephone Encounter (Signed)
 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

## 2024-04-17 ENCOUNTER — Other Ambulatory Visit: Payer: Self-pay

## 2024-04-17 DIAGNOSIS — Z1231 Encounter for screening mammogram for malignant neoplasm of breast: Secondary | ICD-10-CM

## 2024-05-22 ENCOUNTER — Inpatient Hospital Stay: Admission: RE | Admit: 2024-05-22 | Discharge: 2024-05-22

## 2024-05-22 DIAGNOSIS — Z1231 Encounter for screening mammogram for malignant neoplasm of breast: Secondary | ICD-10-CM
